# Patient Record
Sex: Female | Born: 1935 | Race: White | Hispanic: No
Health system: Southern US, Community
[De-identification: ages and names within clinical notes are randomized; demographics above are authoritative.]

## PROBLEM LIST (undated history)

## (undated) DIAGNOSIS — I10 Essential (primary) hypertension: Secondary | ICD-10-CM

## (undated) DIAGNOSIS — M949 Disorder of cartilage, unspecified: Secondary | ICD-10-CM

## (undated) DIAGNOSIS — E039 Hypothyroidism, unspecified: Secondary | ICD-10-CM

## (undated) DIAGNOSIS — K5730 Diverticulosis of large intestine without perforation or abscess without bleeding: Secondary | ICD-10-CM

## (undated) DIAGNOSIS — G479 Sleep disorder, unspecified: Secondary | ICD-10-CM

## (undated) DIAGNOSIS — E538 Deficiency of other specified B group vitamins: Secondary | ICD-10-CM

## (undated) DIAGNOSIS — IMO0001 Reserved for inherently not codable concepts without codable children: Secondary | ICD-10-CM

## (undated) DIAGNOSIS — E785 Hyperlipidemia, unspecified: Secondary | ICD-10-CM

## (undated) DIAGNOSIS — M419 Scoliosis, unspecified: Secondary | ICD-10-CM

## (undated) DIAGNOSIS — Z86010 Personal history of colonic polyps: Secondary | ICD-10-CM

## (undated) DIAGNOSIS — K219 Gastro-esophageal reflux disease without esophagitis: Secondary | ICD-10-CM

## (undated) DIAGNOSIS — M899 Disorder of bone, unspecified: Secondary | ICD-10-CM

## (undated) HISTORY — DX: Personal history of colonic polyps: Z86.010

## (undated) HISTORY — DX: Essential (primary) hypertension: I10

## (undated) HISTORY — DX: Disorder of bone, unspecified: M89.9

## (undated) HISTORY — DX: Reserved for inherently not codable concepts without codable children: IMO0001

## (undated) HISTORY — DX: Hypothyroidism, unspecified: E03.9

## (undated) HISTORY — DX: Hyperlipidemia, unspecified: E78.5

## (undated) HISTORY — DX: Gastro-esophageal reflux disease without esophagitis: K21.9

## (undated) HISTORY — DX: Diverticulosis of large intestine without perforation or abscess without bleeding: K57.30

## (undated) HISTORY — DX: Disorder of cartilage, unspecified: M94.9

## (undated) HISTORY — DX: Deficiency of other specified B group vitamins: E53.8

## (undated) HISTORY — DX: Scoliosis, unspecified: M41.9

## (undated) HISTORY — DX: Sleep disorder, unspecified: G47.9

---

## 1998-09-26 ENCOUNTER — Other Ambulatory Visit: Admission: RE | Admit: 1998-09-26 | Discharge: 1998-09-26 | Payer: Self-pay

## 1999-10-08 ENCOUNTER — Other Ambulatory Visit: Admission: RE | Admit: 1999-10-08 | Discharge: 1999-10-08 | Payer: Self-pay | Admitting: Obstetrics and Gynecology

## 1999-10-11 ENCOUNTER — Ambulatory Visit (HOSPITAL_COMMUNITY): Admission: RE | Admit: 1999-10-11 | Discharge: 1999-10-11 | Payer: Self-pay | Admitting: Obstetrics and Gynecology

## 2000-02-18 ENCOUNTER — Encounter: Payer: Self-pay | Admitting: Family Medicine

## 2000-02-18 ENCOUNTER — Encounter: Admission: RE | Admit: 2000-02-18 | Discharge: 2000-02-18 | Payer: Self-pay | Admitting: Family Medicine

## 2001-02-06 ENCOUNTER — Other Ambulatory Visit: Admission: RE | Admit: 2001-02-06 | Discharge: 2001-02-06 | Payer: Self-pay | Admitting: Family Medicine

## 2002-01-13 ENCOUNTER — Encounter: Payer: Self-pay | Admitting: Family Medicine

## 2002-01-13 ENCOUNTER — Encounter: Admission: RE | Admit: 2002-01-13 | Discharge: 2002-01-13 | Payer: Self-pay | Admitting: Family Medicine

## 2002-09-03 ENCOUNTER — Encounter: Admission: RE | Admit: 2002-09-03 | Discharge: 2002-09-03 | Payer: Self-pay | Admitting: Internal Medicine

## 2002-09-03 ENCOUNTER — Encounter: Payer: Self-pay | Admitting: Internal Medicine

## 2003-07-26 ENCOUNTER — Encounter: Admission: RE | Admit: 2003-07-26 | Discharge: 2003-07-26 | Payer: Self-pay | Admitting: Internal Medicine

## 2004-05-07 ENCOUNTER — Encounter: Admission: RE | Admit: 2004-05-07 | Discharge: 2004-05-07 | Payer: Self-pay | Admitting: Family Medicine

## 2004-07-23 ENCOUNTER — Ambulatory Visit: Payer: Self-pay | Admitting: Pulmonary Disease

## 2004-08-31 ENCOUNTER — Ambulatory Visit: Payer: Self-pay | Admitting: Pulmonary Disease

## 2004-10-11 ENCOUNTER — Ambulatory Visit (HOSPITAL_COMMUNITY): Admission: RE | Admit: 2004-10-11 | Discharge: 2004-10-11 | Payer: Self-pay | Admitting: Internal Medicine

## 2005-04-09 ENCOUNTER — Ambulatory Visit: Payer: Self-pay | Admitting: Pulmonary Disease

## 2005-05-16 ENCOUNTER — Ambulatory Visit: Payer: Self-pay | Admitting: Pulmonary Disease

## 2005-12-24 ENCOUNTER — Ambulatory Visit (HOSPITAL_COMMUNITY): Admission: RE | Admit: 2005-12-24 | Discharge: 2005-12-24 | Payer: Self-pay | Admitting: Internal Medicine

## 2007-04-30 ENCOUNTER — Ambulatory Visit (HOSPITAL_COMMUNITY): Admission: RE | Admit: 2007-04-30 | Discharge: 2007-04-30 | Payer: Self-pay | Admitting: *Deleted

## 2008-05-31 ENCOUNTER — Ambulatory Visit (HOSPITAL_COMMUNITY): Admission: RE | Admit: 2008-05-31 | Discharge: 2008-05-31 | Payer: Self-pay | Admitting: *Deleted

## 2009-06-20 ENCOUNTER — Ambulatory Visit (HOSPITAL_COMMUNITY): Admission: RE | Admit: 2009-06-20 | Discharge: 2009-06-20 | Payer: Self-pay | Admitting: *Deleted

## 2009-12-28 ENCOUNTER — Encounter: Admission: RE | Admit: 2009-12-28 | Discharge: 2009-12-28 | Payer: Self-pay | Admitting: Internal Medicine

## 2010-07-24 ENCOUNTER — Ambulatory Visit (HOSPITAL_COMMUNITY): Admission: RE | Admit: 2010-07-24 | Discharge: 2010-07-24 | Payer: Self-pay | Admitting: Pediatric Nephrology

## 2010-09-25 ENCOUNTER — Emergency Department (HOSPITAL_BASED_OUTPATIENT_CLINIC_OR_DEPARTMENT_OTHER)
Admission: EM | Admit: 2010-09-25 | Discharge: 2010-09-26 | Payer: Self-pay | Source: Home / Self Care | Admitting: Emergency Medicine

## 2010-10-07 ENCOUNTER — Encounter: Payer: Self-pay | Admitting: Family Medicine

## 2011-08-19 ENCOUNTER — Other Ambulatory Visit (HOSPITAL_COMMUNITY): Payer: Self-pay | Admitting: Family Medicine

## 2011-08-19 DIAGNOSIS — Z1231 Encounter for screening mammogram for malignant neoplasm of breast: Secondary | ICD-10-CM

## 2011-08-30 ENCOUNTER — Ambulatory Visit (HOSPITAL_COMMUNITY)
Admission: RE | Admit: 2011-08-30 | Discharge: 2011-08-30 | Disposition: A | Discharge status: Home / Self Care | Payer: Medicare Other | Source: Ambulatory Visit | Attending: Family Medicine | Admitting: Family Medicine

## 2011-08-30 DIAGNOSIS — Z1231 Encounter for screening mammogram for malignant neoplasm of breast: Secondary | ICD-10-CM | POA: Insufficient documentation

## 2011-11-04 DIAGNOSIS — H43819 Vitreous degeneration, unspecified eye: Secondary | ICD-10-CM | POA: Diagnosis not present

## 2011-11-04 DIAGNOSIS — H2510 Age-related nuclear cataract, unspecified eye: Secondary | ICD-10-CM | POA: Diagnosis not present

## 2011-11-04 DIAGNOSIS — H35039 Hypertensive retinopathy, unspecified eye: Secondary | ICD-10-CM | POA: Diagnosis not present

## 2011-11-04 DIAGNOSIS — H538 Other visual disturbances: Secondary | ICD-10-CM | POA: Diagnosis not present

## 2011-12-12 DIAGNOSIS — E039 Hypothyroidism, unspecified: Secondary | ICD-10-CM | POA: Diagnosis not present

## 2011-12-12 DIAGNOSIS — E785 Hyperlipidemia, unspecified: Secondary | ICD-10-CM | POA: Diagnosis not present

## 2011-12-12 DIAGNOSIS — I10 Essential (primary) hypertension: Secondary | ICD-10-CM | POA: Diagnosis not present

## 2011-12-12 DIAGNOSIS — Z79899 Other long term (current) drug therapy: Secondary | ICD-10-CM | POA: Diagnosis not present

## 2011-12-26 DIAGNOSIS — Z79899 Other long term (current) drug therapy: Secondary | ICD-10-CM | POA: Diagnosis not present

## 2011-12-26 DIAGNOSIS — E039 Hypothyroidism, unspecified: Secondary | ICD-10-CM | POA: Diagnosis not present

## 2011-12-26 DIAGNOSIS — I10 Essential (primary) hypertension: Secondary | ICD-10-CM | POA: Diagnosis not present

## 2011-12-26 DIAGNOSIS — E782 Mixed hyperlipidemia: Secondary | ICD-10-CM | POA: Diagnosis not present

## 2012-01-14 DIAGNOSIS — R490 Dysphonia: Secondary | ICD-10-CM | POA: Diagnosis not present

## 2012-01-14 DIAGNOSIS — K219 Gastro-esophageal reflux disease without esophagitis: Secondary | ICD-10-CM | POA: Diagnosis not present

## 2012-09-02 DIAGNOSIS — M5137 Other intervertebral disc degeneration, lumbosacral region: Secondary | ICD-10-CM | POA: Diagnosis not present

## 2012-09-02 DIAGNOSIS — M999 Biomechanical lesion, unspecified: Secondary | ICD-10-CM | POA: Diagnosis not present

## 2012-09-02 DIAGNOSIS — M62838 Other muscle spasm: Secondary | ICD-10-CM | POA: Diagnosis not present

## 2012-10-12 ENCOUNTER — Other Ambulatory Visit (HOSPITAL_COMMUNITY): Payer: Self-pay | Admitting: Family Medicine

## 2012-10-12 DIAGNOSIS — Z1231 Encounter for screening mammogram for malignant neoplasm of breast: Secondary | ICD-10-CM

## 2012-10-22 ENCOUNTER — Ambulatory Visit (HOSPITAL_COMMUNITY)
Admission: RE | Admit: 2012-10-22 | Discharge: 2012-10-22 | Disposition: A | Discharge status: Home / Self Care | Payer: Medicare Other | Source: Ambulatory Visit | Attending: Family Medicine | Admitting: Family Medicine

## 2012-10-22 DIAGNOSIS — Z1231 Encounter for screening mammogram for malignant neoplasm of breast: Secondary | ICD-10-CM | POA: Diagnosis not present

## 2012-11-09 DIAGNOSIS — H04129 Dry eye syndrome of unspecified lacrimal gland: Secondary | ICD-10-CM | POA: Diagnosis not present

## 2012-12-03 DIAGNOSIS — R7301 Impaired fasting glucose: Secondary | ICD-10-CM | POA: Diagnosis not present

## 2012-12-15 DIAGNOSIS — E782 Mixed hyperlipidemia: Secondary | ICD-10-CM | POA: Diagnosis not present

## 2012-12-15 DIAGNOSIS — E7800 Pure hypercholesterolemia, unspecified: Secondary | ICD-10-CM | POA: Diagnosis not present

## 2012-12-15 DIAGNOSIS — I10 Essential (primary) hypertension: Secondary | ICD-10-CM | POA: Diagnosis not present

## 2012-12-15 DIAGNOSIS — Z79899 Other long term (current) drug therapy: Secondary | ICD-10-CM | POA: Diagnosis not present

## 2012-12-15 DIAGNOSIS — E039 Hypothyroidism, unspecified: Secondary | ICD-10-CM | POA: Diagnosis not present

## 2013-01-11 DIAGNOSIS — Z86010 Personal history of colonic polyps: Secondary | ICD-10-CM | POA: Diagnosis not present

## 2013-01-11 DIAGNOSIS — K219 Gastro-esophageal reflux disease without esophagitis: Secondary | ICD-10-CM | POA: Diagnosis not present

## 2013-01-20 DIAGNOSIS — K219 Gastro-esophageal reflux disease without esophagitis: Secondary | ICD-10-CM | POA: Diagnosis not present

## 2013-01-20 DIAGNOSIS — R490 Dysphonia: Secondary | ICD-10-CM | POA: Diagnosis not present

## 2013-02-05 DIAGNOSIS — J383 Other diseases of vocal cords: Secondary | ICD-10-CM | POA: Diagnosis not present

## 2013-02-05 DIAGNOSIS — D141 Benign neoplasm of larynx: Secondary | ICD-10-CM | POA: Diagnosis not present

## 2013-02-05 DIAGNOSIS — R490 Dysphonia: Secondary | ICD-10-CM | POA: Diagnosis not present

## 2013-03-22 DIAGNOSIS — M999 Biomechanical lesion, unspecified: Secondary | ICD-10-CM | POA: Diagnosis not present

## 2013-03-22 DIAGNOSIS — M5137 Other intervertebral disc degeneration, lumbosacral region: Secondary | ICD-10-CM | POA: Diagnosis not present

## 2013-03-22 DIAGNOSIS — M62838 Other muscle spasm: Secondary | ICD-10-CM | POA: Diagnosis not present

## 2013-05-17 DIAGNOSIS — R209 Unspecified disturbances of skin sensation: Secondary | ICD-10-CM | POA: Diagnosis not present

## 2013-05-17 DIAGNOSIS — R232 Flushing: Secondary | ICD-10-CM | POA: Diagnosis not present

## 2013-05-17 DIAGNOSIS — E039 Hypothyroidism, unspecified: Secondary | ICD-10-CM | POA: Diagnosis not present

## 2013-05-17 DIAGNOSIS — Z7982 Long term (current) use of aspirin: Secondary | ICD-10-CM | POA: Diagnosis not present

## 2013-05-17 DIAGNOSIS — Z79899 Other long term (current) drug therapy: Secondary | ICD-10-CM | POA: Diagnosis not present

## 2013-05-17 DIAGNOSIS — I10 Essential (primary) hypertension: Secondary | ICD-10-CM | POA: Diagnosis not present

## 2013-05-24 DIAGNOSIS — K5730 Diverticulosis of large intestine without perforation or abscess without bleeding: Secondary | ICD-10-CM | POA: Diagnosis not present

## 2013-05-24 DIAGNOSIS — Z09 Encounter for follow-up examination after completed treatment for conditions other than malignant neoplasm: Secondary | ICD-10-CM | POA: Diagnosis not present

## 2013-05-24 DIAGNOSIS — Z86010 Personal history of colonic polyps: Secondary | ICD-10-CM | POA: Diagnosis not present

## 2013-05-24 DIAGNOSIS — D126 Benign neoplasm of colon, unspecified: Secondary | ICD-10-CM | POA: Diagnosis not present

## 2013-05-25 DIAGNOSIS — R7301 Impaired fasting glucose: Secondary | ICD-10-CM | POA: Diagnosis not present

## 2013-05-25 DIAGNOSIS — Z79899 Other long term (current) drug therapy: Secondary | ICD-10-CM | POA: Diagnosis not present

## 2013-05-25 DIAGNOSIS — R259 Unspecified abnormal involuntary movements: Secondary | ICD-10-CM | POA: Diagnosis not present

## 2013-05-25 DIAGNOSIS — R209 Unspecified disturbances of skin sensation: Secondary | ICD-10-CM | POA: Diagnosis not present

## 2013-05-25 DIAGNOSIS — E039 Hypothyroidism, unspecified: Secondary | ICD-10-CM | POA: Diagnosis not present

## 2013-05-25 DIAGNOSIS — R9389 Abnormal findings on diagnostic imaging of other specified body structures: Secondary | ICD-10-CM | POA: Diagnosis not present

## 2013-06-07 DIAGNOSIS — I10 Essential (primary) hypertension: Secondary | ICD-10-CM | POA: Diagnosis not present

## 2013-06-07 DIAGNOSIS — E871 Hypo-osmolality and hyponatremia: Secondary | ICD-10-CM | POA: Diagnosis not present

## 2013-06-07 DIAGNOSIS — F411 Generalized anxiety disorder: Secondary | ICD-10-CM | POA: Diagnosis not present

## 2013-06-07 DIAGNOSIS — E039 Hypothyroidism, unspecified: Secondary | ICD-10-CM | POA: Diagnosis not present

## 2013-07-05 DIAGNOSIS — I10 Essential (primary) hypertension: Secondary | ICD-10-CM | POA: Diagnosis not present

## 2013-07-20 DIAGNOSIS — I10 Essential (primary) hypertension: Secondary | ICD-10-CM | POA: Diagnosis not present

## 2013-07-20 DIAGNOSIS — E039 Hypothyroidism, unspecified: Secondary | ICD-10-CM | POA: Diagnosis not present

## 2013-07-20 DIAGNOSIS — E871 Hypo-osmolality and hyponatremia: Secondary | ICD-10-CM | POA: Diagnosis not present

## 2013-07-20 DIAGNOSIS — E782 Mixed hyperlipidemia: Secondary | ICD-10-CM | POA: Diagnosis not present

## 2013-08-04 DIAGNOSIS — J040 Acute laryngitis: Secondary | ICD-10-CM | POA: Diagnosis not present

## 2013-08-24 DIAGNOSIS — R490 Dysphonia: Secondary | ICD-10-CM | POA: Diagnosis not present

## 2013-08-24 DIAGNOSIS — J381 Polyp of vocal cord and larynx: Secondary | ICD-10-CM | POA: Diagnosis not present

## 2013-09-07 DIAGNOSIS — E039 Hypothyroidism, unspecified: Secondary | ICD-10-CM | POA: Diagnosis not present

## 2013-09-27 DIAGNOSIS — M5137 Other intervertebral disc degeneration, lumbosacral region: Secondary | ICD-10-CM | POA: Diagnosis not present

## 2013-09-27 DIAGNOSIS — M62838 Other muscle spasm: Secondary | ICD-10-CM | POA: Diagnosis not present

## 2013-09-27 DIAGNOSIS — M999 Biomechanical lesion, unspecified: Secondary | ICD-10-CM | POA: Diagnosis not present

## 2013-10-05 DIAGNOSIS — G609 Hereditary and idiopathic neuropathy, unspecified: Secondary | ICD-10-CM | POA: Diagnosis not present

## 2013-10-05 DIAGNOSIS — G2581 Restless legs syndrome: Secondary | ICD-10-CM | POA: Diagnosis not present

## 2013-10-05 DIAGNOSIS — R002 Palpitations: Secondary | ICD-10-CM | POA: Diagnosis not present

## 2013-10-05 DIAGNOSIS — M545 Low back pain, unspecified: Secondary | ICD-10-CM | POA: Diagnosis not present

## 2013-10-05 DIAGNOSIS — E559 Vitamin D deficiency, unspecified: Secondary | ICD-10-CM | POA: Diagnosis not present

## 2013-10-18 DIAGNOSIS — M62838 Other muscle spasm: Secondary | ICD-10-CM | POA: Diagnosis not present

## 2013-10-18 DIAGNOSIS — M999 Biomechanical lesion, unspecified: Secondary | ICD-10-CM | POA: Diagnosis not present

## 2013-10-18 DIAGNOSIS — M5137 Other intervertebral disc degeneration, lumbosacral region: Secondary | ICD-10-CM | POA: Diagnosis not present

## 2013-10-25 DIAGNOSIS — M62838 Other muscle spasm: Secondary | ICD-10-CM | POA: Diagnosis not present

## 2013-10-25 DIAGNOSIS — M999 Biomechanical lesion, unspecified: Secondary | ICD-10-CM | POA: Diagnosis not present

## 2013-10-25 DIAGNOSIS — M5137 Other intervertebral disc degeneration, lumbosacral region: Secondary | ICD-10-CM | POA: Diagnosis not present

## 2013-11-05 DIAGNOSIS — E039 Hypothyroidism, unspecified: Secondary | ICD-10-CM | POA: Diagnosis not present

## 2013-11-05 DIAGNOSIS — M545 Low back pain, unspecified: Secondary | ICD-10-CM | POA: Diagnosis not present

## 2013-11-05 DIAGNOSIS — E782 Mixed hyperlipidemia: Secondary | ICD-10-CM | POA: Diagnosis not present

## 2013-11-05 DIAGNOSIS — G2581 Restless legs syndrome: Secondary | ICD-10-CM | POA: Diagnosis not present

## 2013-11-05 DIAGNOSIS — R002 Palpitations: Secondary | ICD-10-CM | POA: Diagnosis not present

## 2013-11-05 DIAGNOSIS — I10 Essential (primary) hypertension: Secondary | ICD-10-CM | POA: Diagnosis not present

## 2013-11-05 DIAGNOSIS — G609 Hereditary and idiopathic neuropathy, unspecified: Secondary | ICD-10-CM | POA: Diagnosis not present

## 2013-11-05 DIAGNOSIS — E871 Hypo-osmolality and hyponatremia: Secondary | ICD-10-CM | POA: Diagnosis not present

## 2013-12-09 ENCOUNTER — Other Ambulatory Visit (HOSPITAL_COMMUNITY): Payer: Self-pay | Admitting: Family Medicine

## 2013-12-09 DIAGNOSIS — Z1231 Encounter for screening mammogram for malignant neoplasm of breast: Secondary | ICD-10-CM

## 2013-12-16 ENCOUNTER — Ambulatory Visit (HOSPITAL_COMMUNITY)
Admission: RE | Admit: 2013-12-16 | Discharge: 2013-12-16 | Disposition: A | Discharge status: Home / Self Care | Payer: Medicare Other | Source: Ambulatory Visit | Attending: Family Medicine | Admitting: Family Medicine

## 2013-12-16 DIAGNOSIS — Z1231 Encounter for screening mammogram for malignant neoplasm of breast: Secondary | ICD-10-CM | POA: Diagnosis not present

## 2013-12-23 DIAGNOSIS — L80 Vitiligo: Secondary | ICD-10-CM | POA: Diagnosis not present

## 2013-12-23 DIAGNOSIS — L821 Other seborrheic keratosis: Secondary | ICD-10-CM | POA: Diagnosis not present

## 2013-12-23 DIAGNOSIS — Z808 Family history of malignant neoplasm of other organs or systems: Secondary | ICD-10-CM | POA: Diagnosis not present

## 2013-12-23 DIAGNOSIS — L723 Sebaceous cyst: Secondary | ICD-10-CM | POA: Diagnosis not present

## 2013-12-23 DIAGNOSIS — D239 Other benign neoplasm of skin, unspecified: Secondary | ICD-10-CM | POA: Diagnosis not present

## 2014-01-11 DIAGNOSIS — I10 Essential (primary) hypertension: Secondary | ICD-10-CM | POA: Diagnosis not present

## 2014-01-11 DIAGNOSIS — F411 Generalized anxiety disorder: Secondary | ICD-10-CM | POA: Diagnosis not present

## 2014-01-11 DIAGNOSIS — Z0000 Encounter for general adult medical examination without abnormal findings: Secondary | ICD-10-CM | POA: Diagnosis not present

## 2014-01-11 DIAGNOSIS — G479 Sleep disorder, unspecified: Secondary | ICD-10-CM | POA: Diagnosis not present

## 2014-01-11 DIAGNOSIS — E782 Mixed hyperlipidemia: Secondary | ICD-10-CM | POA: Diagnosis not present

## 2014-01-11 DIAGNOSIS — E039 Hypothyroidism, unspecified: Secondary | ICD-10-CM | POA: Diagnosis not present

## 2014-01-11 DIAGNOSIS — E2839 Other primary ovarian failure: Secondary | ICD-10-CM | POA: Diagnosis not present

## 2014-01-11 DIAGNOSIS — Z01419 Encounter for gynecological examination (general) (routine) without abnormal findings: Secondary | ICD-10-CM | POA: Diagnosis not present

## 2014-01-11 DIAGNOSIS — K219 Gastro-esophageal reflux disease without esophagitis: Secondary | ICD-10-CM | POA: Diagnosis not present

## 2014-02-03 DIAGNOSIS — L0201 Cutaneous abscess of face: Secondary | ICD-10-CM | POA: Diagnosis not present

## 2014-02-03 DIAGNOSIS — L03211 Cellulitis of face: Secondary | ICD-10-CM | POA: Diagnosis not present

## 2014-02-09 DIAGNOSIS — M899 Disorder of bone, unspecified: Secondary | ICD-10-CM | POA: Diagnosis not present

## 2014-02-09 DIAGNOSIS — M949 Disorder of cartilage, unspecified: Secondary | ICD-10-CM | POA: Diagnosis not present

## 2014-03-01 DIAGNOSIS — J209 Acute bronchitis, unspecified: Secondary | ICD-10-CM | POA: Diagnosis not present

## 2014-04-11 DIAGNOSIS — E782 Mixed hyperlipidemia: Secondary | ICD-10-CM | POA: Diagnosis not present

## 2014-04-11 DIAGNOSIS — R7309 Other abnormal glucose: Secondary | ICD-10-CM | POA: Diagnosis not present

## 2014-04-11 DIAGNOSIS — E039 Hypothyroidism, unspecified: Secondary | ICD-10-CM | POA: Diagnosis not present

## 2014-04-11 DIAGNOSIS — I10 Essential (primary) hypertension: Secondary | ICD-10-CM | POA: Diagnosis not present

## 2014-04-11 DIAGNOSIS — G609 Hereditary and idiopathic neuropathy, unspecified: Secondary | ICD-10-CM | POA: Diagnosis not present

## 2014-04-11 DIAGNOSIS — E559 Vitamin D deficiency, unspecified: Secondary | ICD-10-CM | POA: Diagnosis not present

## 2014-04-13 DIAGNOSIS — Z23 Encounter for immunization: Secondary | ICD-10-CM | POA: Diagnosis not present

## 2014-04-13 DIAGNOSIS — M949 Disorder of cartilage, unspecified: Secondary | ICD-10-CM | POA: Diagnosis not present

## 2014-04-13 DIAGNOSIS — K219 Gastro-esophageal reflux disease without esophagitis: Secondary | ICD-10-CM | POA: Diagnosis not present

## 2014-04-13 DIAGNOSIS — M899 Disorder of bone, unspecified: Secondary | ICD-10-CM | POA: Diagnosis not present

## 2014-04-13 DIAGNOSIS — E039 Hypothyroidism, unspecified: Secondary | ICD-10-CM | POA: Diagnosis not present

## 2014-04-13 DIAGNOSIS — I10 Essential (primary) hypertension: Secondary | ICD-10-CM | POA: Diagnosis not present

## 2014-04-13 DIAGNOSIS — E782 Mixed hyperlipidemia: Secondary | ICD-10-CM | POA: Diagnosis not present

## 2014-04-13 DIAGNOSIS — G479 Sleep disorder, unspecified: Secondary | ICD-10-CM | POA: Diagnosis not present

## 2014-04-13 DIAGNOSIS — Z86010 Personal history of colonic polyps: Secondary | ICD-10-CM | POA: Diagnosis not present

## 2014-05-07 ENCOUNTER — Encounter: Payer: Self-pay | Admitting: *Deleted

## 2014-06-08 DIAGNOSIS — K219 Gastro-esophageal reflux disease without esophagitis: Secondary | ICD-10-CM | POA: Diagnosis not present

## 2014-06-08 DIAGNOSIS — J381 Polyp of vocal cord and larynx: Secondary | ICD-10-CM | POA: Diagnosis not present

## 2014-06-08 DIAGNOSIS — R490 Dysphonia: Secondary | ICD-10-CM | POA: Diagnosis not present

## 2014-06-24 DIAGNOSIS — Z23 Encounter for immunization: Secondary | ICD-10-CM | POA: Diagnosis not present

## 2014-09-06 DIAGNOSIS — I10 Essential (primary) hypertension: Secondary | ICD-10-CM | POA: Diagnosis not present

## 2014-09-06 DIAGNOSIS — G609 Hereditary and idiopathic neuropathy, unspecified: Secondary | ICD-10-CM | POA: Diagnosis not present

## 2014-09-06 DIAGNOSIS — E782 Mixed hyperlipidemia: Secondary | ICD-10-CM | POA: Diagnosis not present

## 2014-09-06 DIAGNOSIS — E039 Hypothyroidism, unspecified: Secondary | ICD-10-CM | POA: Diagnosis not present

## 2014-09-06 DIAGNOSIS — M899 Disorder of bone, unspecified: Secondary | ICD-10-CM | POA: Diagnosis not present

## 2014-09-06 DIAGNOSIS — Z86010 Personal history of colonic polyps: Secondary | ICD-10-CM | POA: Diagnosis not present

## 2014-09-06 DIAGNOSIS — Z1211 Encounter for screening for malignant neoplasm of colon: Secondary | ICD-10-CM | POA: Diagnosis not present

## 2014-09-06 DIAGNOSIS — K219 Gastro-esophageal reflux disease without esophagitis: Secondary | ICD-10-CM | POA: Diagnosis not present

## 2014-10-11 DIAGNOSIS — E2839 Other primary ovarian failure: Secondary | ICD-10-CM | POA: Diagnosis not present

## 2014-10-11 DIAGNOSIS — E538 Deficiency of other specified B group vitamins: Secondary | ICD-10-CM | POA: Diagnosis not present

## 2014-10-11 DIAGNOSIS — G479 Sleep disorder, unspecified: Secondary | ICD-10-CM | POA: Diagnosis not present

## 2014-10-11 DIAGNOSIS — E559 Vitamin D deficiency, unspecified: Secondary | ICD-10-CM | POA: Diagnosis not present

## 2014-10-11 DIAGNOSIS — I10 Essential (primary) hypertension: Secondary | ICD-10-CM | POA: Diagnosis not present

## 2014-10-11 DIAGNOSIS — R7309 Other abnormal glucose: Secondary | ICD-10-CM | POA: Diagnosis not present

## 2014-10-11 DIAGNOSIS — E039 Hypothyroidism, unspecified: Secondary | ICD-10-CM | POA: Diagnosis not present

## 2014-10-11 DIAGNOSIS — G609 Hereditary and idiopathic neuropathy, unspecified: Secondary | ICD-10-CM | POA: Diagnosis not present

## 2014-10-11 DIAGNOSIS — Z86010 Personal history of colonic polyps: Secondary | ICD-10-CM | POA: Diagnosis not present

## 2014-10-11 DIAGNOSIS — M899 Disorder of bone, unspecified: Secondary | ICD-10-CM | POA: Diagnosis not present

## 2014-10-11 DIAGNOSIS — E782 Mixed hyperlipidemia: Secondary | ICD-10-CM | POA: Diagnosis not present

## 2014-10-11 DIAGNOSIS — K219 Gastro-esophageal reflux disease without esophagitis: Secondary | ICD-10-CM | POA: Diagnosis not present

## 2014-10-14 DIAGNOSIS — J387 Other diseases of larynx: Secondary | ICD-10-CM | POA: Diagnosis not present

## 2014-10-14 DIAGNOSIS — E039 Hypothyroidism, unspecified: Secondary | ICD-10-CM | POA: Diagnosis not present

## 2014-10-14 DIAGNOSIS — R7309 Other abnormal glucose: Secondary | ICD-10-CM | POA: Diagnosis not present

## 2014-10-14 DIAGNOSIS — E559 Vitamin D deficiency, unspecified: Secondary | ICD-10-CM | POA: Diagnosis not present

## 2014-10-14 DIAGNOSIS — I10 Essential (primary) hypertension: Secondary | ICD-10-CM | POA: Diagnosis not present

## 2014-10-14 DIAGNOSIS — K219 Gastro-esophageal reflux disease without esophagitis: Secondary | ICD-10-CM | POA: Diagnosis not present

## 2014-10-14 DIAGNOSIS — E538 Deficiency of other specified B group vitamins: Secondary | ICD-10-CM | POA: Diagnosis not present

## 2014-10-14 DIAGNOSIS — E782 Mixed hyperlipidemia: Secondary | ICD-10-CM | POA: Diagnosis not present

## 2014-10-14 DIAGNOSIS — Z86010 Personal history of colonic polyps: Secondary | ICD-10-CM | POA: Diagnosis not present

## 2014-10-14 DIAGNOSIS — G479 Sleep disorder, unspecified: Secondary | ICD-10-CM | POA: Diagnosis not present

## 2014-11-07 DIAGNOSIS — M62830 Muscle spasm of back: Secondary | ICD-10-CM | POA: Diagnosis not present

## 2014-11-07 DIAGNOSIS — M5136 Other intervertebral disc degeneration, lumbar region: Secondary | ICD-10-CM | POA: Diagnosis not present

## 2014-11-07 DIAGNOSIS — M9903 Segmental and somatic dysfunction of lumbar region: Secondary | ICD-10-CM | POA: Diagnosis not present

## 2014-11-18 DIAGNOSIS — R0981 Nasal congestion: Secondary | ICD-10-CM | POA: Diagnosis not present

## 2015-01-09 DIAGNOSIS — G479 Sleep disorder, unspecified: Secondary | ICD-10-CM | POA: Diagnosis not present

## 2015-01-09 DIAGNOSIS — R7309 Other abnormal glucose: Secondary | ICD-10-CM | POA: Diagnosis not present

## 2015-01-09 DIAGNOSIS — E782 Mixed hyperlipidemia: Secondary | ICD-10-CM | POA: Diagnosis not present

## 2015-01-09 DIAGNOSIS — I10 Essential (primary) hypertension: Secondary | ICD-10-CM | POA: Diagnosis not present

## 2015-01-09 DIAGNOSIS — E039 Hypothyroidism, unspecified: Secondary | ICD-10-CM | POA: Diagnosis not present

## 2015-01-09 DIAGNOSIS — Z86010 Personal history of colonic polyps: Secondary | ICD-10-CM | POA: Diagnosis not present

## 2015-01-09 DIAGNOSIS — E538 Deficiency of other specified B group vitamins: Secondary | ICD-10-CM | POA: Diagnosis not present

## 2015-01-09 DIAGNOSIS — K219 Gastro-esophageal reflux disease without esophagitis: Secondary | ICD-10-CM | POA: Diagnosis not present

## 2015-01-09 DIAGNOSIS — E559 Vitamin D deficiency, unspecified: Secondary | ICD-10-CM | POA: Diagnosis not present

## 2015-01-13 DIAGNOSIS — B354 Tinea corporis: Secondary | ICD-10-CM | POA: Diagnosis not present

## 2015-01-13 DIAGNOSIS — E039 Hypothyroidism, unspecified: Secondary | ICD-10-CM | POA: Diagnosis not present

## 2015-01-13 DIAGNOSIS — E782 Mixed hyperlipidemia: Secondary | ICD-10-CM | POA: Diagnosis not present

## 2015-01-13 DIAGNOSIS — E538 Deficiency of other specified B group vitamins: Secondary | ICD-10-CM | POA: Diagnosis not present

## 2015-01-13 DIAGNOSIS — E559 Vitamin D deficiency, unspecified: Secondary | ICD-10-CM | POA: Diagnosis not present

## 2015-01-13 DIAGNOSIS — I10 Essential (primary) hypertension: Secondary | ICD-10-CM | POA: Diagnosis not present

## 2015-01-13 DIAGNOSIS — Z0000 Encounter for general adult medical examination without abnormal findings: Secondary | ICD-10-CM | POA: Diagnosis not present

## 2015-01-13 DIAGNOSIS — K219 Gastro-esophageal reflux disease without esophagitis: Secondary | ICD-10-CM | POA: Diagnosis not present

## 2015-01-13 DIAGNOSIS — M899 Disorder of bone, unspecified: Secondary | ICD-10-CM | POA: Diagnosis not present

## 2015-03-02 DIAGNOSIS — M9903 Segmental and somatic dysfunction of lumbar region: Secondary | ICD-10-CM | POA: Diagnosis not present

## 2015-03-02 DIAGNOSIS — M5136 Other intervertebral disc degeneration, lumbar region: Secondary | ICD-10-CM | POA: Diagnosis not present

## 2015-03-02 DIAGNOSIS — M62830 Muscle spasm of back: Secondary | ICD-10-CM | POA: Diagnosis not present

## 2015-03-28 DIAGNOSIS — R490 Dysphonia: Secondary | ICD-10-CM | POA: Diagnosis not present

## 2015-03-28 DIAGNOSIS — J381 Polyp of vocal cord and larynx: Secondary | ICD-10-CM | POA: Diagnosis not present

## 2015-04-19 DIAGNOSIS — M899 Disorder of bone, unspecified: Secondary | ICD-10-CM | POA: Diagnosis not present

## 2015-04-19 DIAGNOSIS — E2839 Other primary ovarian failure: Secondary | ICD-10-CM | POA: Diagnosis not present

## 2015-04-19 DIAGNOSIS — R7309 Other abnormal glucose: Secondary | ICD-10-CM | POA: Diagnosis not present

## 2015-04-19 DIAGNOSIS — I10 Essential (primary) hypertension: Secondary | ICD-10-CM | POA: Diagnosis not present

## 2015-04-19 DIAGNOSIS — E039 Hypothyroidism, unspecified: Secondary | ICD-10-CM | POA: Diagnosis not present

## 2015-04-19 DIAGNOSIS — Z86010 Personal history of colonic polyps: Secondary | ICD-10-CM | POA: Diagnosis not present

## 2015-04-19 DIAGNOSIS — E538 Deficiency of other specified B group vitamins: Secondary | ICD-10-CM | POA: Diagnosis not present

## 2015-04-19 DIAGNOSIS — E782 Mixed hyperlipidemia: Secondary | ICD-10-CM | POA: Diagnosis not present

## 2015-04-19 DIAGNOSIS — E559 Vitamin D deficiency, unspecified: Secondary | ICD-10-CM | POA: Diagnosis not present

## 2015-04-19 DIAGNOSIS — K219 Gastro-esophageal reflux disease without esophagitis: Secondary | ICD-10-CM | POA: Diagnosis not present

## 2015-04-19 DIAGNOSIS — G609 Hereditary and idiopathic neuropathy, unspecified: Secondary | ICD-10-CM | POA: Diagnosis not present

## 2015-04-19 DIAGNOSIS — G479 Sleep disorder, unspecified: Secondary | ICD-10-CM | POA: Diagnosis not present

## 2015-04-27 DIAGNOSIS — F329 Major depressive disorder, single episode, unspecified: Secondary | ICD-10-CM | POA: Diagnosis not present

## 2015-04-27 DIAGNOSIS — I10 Essential (primary) hypertension: Secondary | ICD-10-CM | POA: Diagnosis not present

## 2015-04-27 DIAGNOSIS — E039 Hypothyroidism, unspecified: Secondary | ICD-10-CM | POA: Diagnosis not present

## 2015-04-27 DIAGNOSIS — E559 Vitamin D deficiency, unspecified: Secondary | ICD-10-CM | POA: Diagnosis not present

## 2015-04-27 DIAGNOSIS — E782 Mixed hyperlipidemia: Secondary | ICD-10-CM | POA: Diagnosis not present

## 2015-04-27 DIAGNOSIS — G479 Sleep disorder, unspecified: Secondary | ICD-10-CM | POA: Diagnosis not present

## 2015-04-27 DIAGNOSIS — R7301 Impaired fasting glucose: Secondary | ICD-10-CM | POA: Diagnosis not present

## 2015-04-27 DIAGNOSIS — E781 Pure hyperglyceridemia: Secondary | ICD-10-CM | POA: Diagnosis not present

## 2015-04-27 DIAGNOSIS — E538 Deficiency of other specified B group vitamins: Secondary | ICD-10-CM | POA: Diagnosis not present

## 2015-04-27 DIAGNOSIS — K219 Gastro-esophageal reflux disease without esophagitis: Secondary | ICD-10-CM | POA: Diagnosis not present

## 2015-08-02 DIAGNOSIS — M9903 Segmental and somatic dysfunction of lumbar region: Secondary | ICD-10-CM | POA: Diagnosis not present

## 2015-08-02 DIAGNOSIS — M62830 Muscle spasm of back: Secondary | ICD-10-CM | POA: Diagnosis not present

## 2015-08-02 DIAGNOSIS — M5136 Other intervertebral disc degeneration, lumbar region: Secondary | ICD-10-CM | POA: Diagnosis not present

## 2015-08-09 DIAGNOSIS — E559 Vitamin D deficiency, unspecified: Secondary | ICD-10-CM | POA: Diagnosis not present

## 2015-08-09 DIAGNOSIS — R7301 Impaired fasting glucose: Secondary | ICD-10-CM | POA: Diagnosis not present

## 2015-08-09 DIAGNOSIS — G479 Sleep disorder, unspecified: Secondary | ICD-10-CM | POA: Diagnosis not present

## 2015-08-09 DIAGNOSIS — E039 Hypothyroidism, unspecified: Secondary | ICD-10-CM | POA: Diagnosis not present

## 2015-08-09 DIAGNOSIS — F329 Major depressive disorder, single episode, unspecified: Secondary | ICD-10-CM | POA: Diagnosis not present

## 2015-08-09 DIAGNOSIS — I10 Essential (primary) hypertension: Secondary | ICD-10-CM | POA: Diagnosis not present

## 2015-08-09 DIAGNOSIS — E782 Mixed hyperlipidemia: Secondary | ICD-10-CM | POA: Diagnosis not present

## 2015-08-09 DIAGNOSIS — K219 Gastro-esophageal reflux disease without esophagitis: Secondary | ICD-10-CM | POA: Diagnosis not present

## 2015-08-09 DIAGNOSIS — E781 Pure hyperglyceridemia: Secondary | ICD-10-CM | POA: Diagnosis not present

## 2015-08-09 DIAGNOSIS — E538 Deficiency of other specified B group vitamins: Secondary | ICD-10-CM | POA: Diagnosis not present

## 2015-10-12 ENCOUNTER — Other Ambulatory Visit: Payer: Self-pay

## 2015-10-12 DIAGNOSIS — Z1231 Encounter for screening mammogram for malignant neoplasm of breast: Secondary | ICD-10-CM

## 2015-10-18 DIAGNOSIS — H25011 Cortical age-related cataract, right eye: Secondary | ICD-10-CM | POA: Diagnosis not present

## 2015-10-18 DIAGNOSIS — H25012 Cortical age-related cataract, left eye: Secondary | ICD-10-CM | POA: Diagnosis not present

## 2015-10-18 DIAGNOSIS — H35032 Hypertensive retinopathy, left eye: Secondary | ICD-10-CM | POA: Diagnosis not present

## 2015-10-18 DIAGNOSIS — H2511 Age-related nuclear cataract, right eye: Secondary | ICD-10-CM | POA: Diagnosis not present

## 2015-10-18 DIAGNOSIS — H2512 Age-related nuclear cataract, left eye: Secondary | ICD-10-CM | POA: Diagnosis not present

## 2015-10-18 DIAGNOSIS — H35031 Hypertensive retinopathy, right eye: Secondary | ICD-10-CM | POA: Diagnosis not present

## 2015-10-18 DIAGNOSIS — H35372 Puckering of macula, left eye: Secondary | ICD-10-CM | POA: Diagnosis not present

## 2015-10-19 DIAGNOSIS — E782 Mixed hyperlipidemia: Secondary | ICD-10-CM | POA: Diagnosis not present

## 2015-10-19 DIAGNOSIS — K219 Gastro-esophageal reflux disease without esophagitis: Secondary | ICD-10-CM | POA: Diagnosis not present

## 2015-10-19 DIAGNOSIS — G479 Sleep disorder, unspecified: Secondary | ICD-10-CM | POA: Diagnosis not present

## 2015-10-19 DIAGNOSIS — I10 Essential (primary) hypertension: Secondary | ICD-10-CM | POA: Diagnosis not present

## 2015-10-19 DIAGNOSIS — R7301 Impaired fasting glucose: Secondary | ICD-10-CM | POA: Diagnosis not present

## 2015-10-19 DIAGNOSIS — E781 Pure hyperglyceridemia: Secondary | ICD-10-CM | POA: Diagnosis not present

## 2015-10-19 DIAGNOSIS — E039 Hypothyroidism, unspecified: Secondary | ICD-10-CM | POA: Diagnosis not present

## 2015-10-19 DIAGNOSIS — E559 Vitamin D deficiency, unspecified: Secondary | ICD-10-CM | POA: Diagnosis not present

## 2015-10-19 DIAGNOSIS — E538 Deficiency of other specified B group vitamins: Secondary | ICD-10-CM | POA: Diagnosis not present

## 2015-10-19 DIAGNOSIS — F329 Major depressive disorder, single episode, unspecified: Secondary | ICD-10-CM | POA: Diagnosis not present

## 2015-10-27 ENCOUNTER — Ambulatory Visit: Payer: Medicare Other

## 2015-10-27 DIAGNOSIS — E538 Deficiency of other specified B group vitamins: Secondary | ICD-10-CM | POA: Diagnosis not present

## 2015-10-27 DIAGNOSIS — F329 Major depressive disorder, single episode, unspecified: Secondary | ICD-10-CM | POA: Diagnosis not present

## 2015-10-27 DIAGNOSIS — K219 Gastro-esophageal reflux disease without esophagitis: Secondary | ICD-10-CM | POA: Diagnosis not present

## 2015-10-27 DIAGNOSIS — E559 Vitamin D deficiency, unspecified: Secondary | ICD-10-CM | POA: Diagnosis not present

## 2015-10-27 DIAGNOSIS — G609 Hereditary and idiopathic neuropathy, unspecified: Secondary | ICD-10-CM | POA: Diagnosis not present

## 2015-10-27 DIAGNOSIS — E039 Hypothyroidism, unspecified: Secondary | ICD-10-CM | POA: Diagnosis not present

## 2015-10-27 DIAGNOSIS — I10 Essential (primary) hypertension: Secondary | ICD-10-CM | POA: Diagnosis not present

## 2015-10-27 DIAGNOSIS — L300 Nummular dermatitis: Secondary | ICD-10-CM | POA: Diagnosis not present

## 2015-10-27 DIAGNOSIS — R7301 Impaired fasting glucose: Secondary | ICD-10-CM | POA: Diagnosis not present

## 2015-10-27 DIAGNOSIS — E782 Mixed hyperlipidemia: Secondary | ICD-10-CM | POA: Diagnosis not present

## 2015-10-27 DIAGNOSIS — G479 Sleep disorder, unspecified: Secondary | ICD-10-CM | POA: Diagnosis not present

## 2015-10-27 DIAGNOSIS — Z1239 Encounter for other screening for malignant neoplasm of breast: Secondary | ICD-10-CM | POA: Diagnosis not present

## 2015-11-14 DIAGNOSIS — R05 Cough: Secondary | ICD-10-CM | POA: Diagnosis not present

## 2015-11-27 ENCOUNTER — Ambulatory Visit
Admission: RE | Admit: 2015-11-27 | Discharge: 2015-11-27 | Disposition: A | Discharge status: Home / Self Care | Payer: Medicare Other | Source: Ambulatory Visit

## 2015-11-27 DIAGNOSIS — Z1231 Encounter for screening mammogram for malignant neoplasm of breast: Secondary | ICD-10-CM | POA: Diagnosis not present

## 2015-12-19 DIAGNOSIS — E559 Vitamin D deficiency, unspecified: Secondary | ICD-10-CM | POA: Diagnosis not present

## 2015-12-19 DIAGNOSIS — Z1239 Encounter for other screening for malignant neoplasm of breast: Secondary | ICD-10-CM | POA: Diagnosis not present

## 2015-12-19 DIAGNOSIS — G609 Hereditary and idiopathic neuropathy, unspecified: Secondary | ICD-10-CM | POA: Diagnosis not present

## 2015-12-19 DIAGNOSIS — L300 Nummular dermatitis: Secondary | ICD-10-CM | POA: Diagnosis not present

## 2015-12-19 DIAGNOSIS — F329 Major depressive disorder, single episode, unspecified: Secondary | ICD-10-CM | POA: Diagnosis not present

## 2015-12-19 DIAGNOSIS — E039 Hypothyroidism, unspecified: Secondary | ICD-10-CM | POA: Diagnosis not present

## 2015-12-19 DIAGNOSIS — E782 Mixed hyperlipidemia: Secondary | ICD-10-CM | POA: Diagnosis not present

## 2015-12-19 DIAGNOSIS — E538 Deficiency of other specified B group vitamins: Secondary | ICD-10-CM | POA: Diagnosis not present

## 2015-12-19 DIAGNOSIS — I10 Essential (primary) hypertension: Secondary | ICD-10-CM | POA: Diagnosis not present

## 2015-12-19 DIAGNOSIS — K219 Gastro-esophageal reflux disease without esophagitis: Secondary | ICD-10-CM | POA: Diagnosis not present

## 2015-12-19 DIAGNOSIS — R7301 Impaired fasting glucose: Secondary | ICD-10-CM | POA: Diagnosis not present

## 2015-12-19 DIAGNOSIS — G479 Sleep disorder, unspecified: Secondary | ICD-10-CM | POA: Diagnosis not present

## 2015-12-22 DIAGNOSIS — L723 Sebaceous cyst: Secondary | ICD-10-CM | POA: Diagnosis not present

## 2015-12-28 DIAGNOSIS — L0211 Cutaneous abscess of neck: Secondary | ICD-10-CM | POA: Diagnosis not present

## 2016-01-01 DIAGNOSIS — L089 Local infection of the skin and subcutaneous tissue, unspecified: Secondary | ICD-10-CM | POA: Diagnosis not present

## 2016-01-01 DIAGNOSIS — L723 Sebaceous cyst: Secondary | ICD-10-CM | POA: Diagnosis not present

## 2016-01-10 DIAGNOSIS — L089 Local infection of the skin and subcutaneous tissue, unspecified: Secondary | ICD-10-CM | POA: Diagnosis not present

## 2016-01-10 DIAGNOSIS — L723 Sebaceous cyst: Secondary | ICD-10-CM | POA: Diagnosis not present

## 2016-01-15 DIAGNOSIS — I10 Essential (primary) hypertension: Secondary | ICD-10-CM | POA: Diagnosis not present

## 2016-01-15 DIAGNOSIS — G479 Sleep disorder, unspecified: Secondary | ICD-10-CM | POA: Diagnosis not present

## 2016-01-15 DIAGNOSIS — E039 Hypothyroidism, unspecified: Secondary | ICD-10-CM | POA: Diagnosis not present

## 2016-01-15 DIAGNOSIS — Z124 Encounter for screening for malignant neoplasm of cervix: Secondary | ICD-10-CM | POA: Diagnosis not present

## 2016-01-15 DIAGNOSIS — E559 Vitamin D deficiency, unspecified: Secondary | ICD-10-CM | POA: Diagnosis not present

## 2016-01-15 DIAGNOSIS — N765 Ulceration of vagina: Secondary | ICD-10-CM | POA: Diagnosis not present

## 2016-01-15 DIAGNOSIS — Z0001 Encounter for general adult medical examination with abnormal findings: Secondary | ICD-10-CM | POA: Diagnosis not present

## 2016-01-15 DIAGNOSIS — E782 Mixed hyperlipidemia: Secondary | ICD-10-CM | POA: Diagnosis not present

## 2016-01-15 DIAGNOSIS — M85851 Other specified disorders of bone density and structure, right thigh: Secondary | ICD-10-CM | POA: Diagnosis not present

## 2016-02-14 DIAGNOSIS — M85851 Other specified disorders of bone density and structure, right thigh: Secondary | ICD-10-CM | POA: Diagnosis not present

## 2016-02-14 DIAGNOSIS — M8589 Other specified disorders of bone density and structure, multiple sites: Secondary | ICD-10-CM | POA: Diagnosis not present

## 2016-02-21 DIAGNOSIS — L723 Sebaceous cyst: Secondary | ICD-10-CM | POA: Diagnosis not present

## 2016-02-21 DIAGNOSIS — L089 Local infection of the skin and subcutaneous tissue, unspecified: Secondary | ICD-10-CM | POA: Diagnosis not present

## 2016-02-29 DIAGNOSIS — N765 Ulceration of vagina: Secondary | ICD-10-CM | POA: Diagnosis not present

## 2016-02-29 DIAGNOSIS — E538 Deficiency of other specified B group vitamins: Secondary | ICD-10-CM | POA: Diagnosis not present

## 2016-04-23 DIAGNOSIS — R7301 Impaired fasting glucose: Secondary | ICD-10-CM | POA: Diagnosis not present

## 2016-04-23 DIAGNOSIS — E039 Hypothyroidism, unspecified: Secondary | ICD-10-CM | POA: Diagnosis not present

## 2016-04-23 DIAGNOSIS — E559 Vitamin D deficiency, unspecified: Secondary | ICD-10-CM | POA: Diagnosis not present

## 2016-05-08 DIAGNOSIS — E559 Vitamin D deficiency, unspecified: Secondary | ICD-10-CM | POA: Diagnosis not present

## 2016-05-08 DIAGNOSIS — N765 Ulceration of vagina: Secondary | ICD-10-CM | POA: Diagnosis not present

## 2016-05-08 DIAGNOSIS — M899 Disorder of bone, unspecified: Secondary | ICD-10-CM | POA: Diagnosis not present

## 2016-05-08 DIAGNOSIS — K219 Gastro-esophageal reflux disease without esophagitis: Secondary | ICD-10-CM | POA: Diagnosis not present

## 2016-05-08 DIAGNOSIS — E039 Hypothyroidism, unspecified: Secondary | ICD-10-CM | POA: Diagnosis not present

## 2016-05-08 DIAGNOSIS — I10 Essential (primary) hypertension: Secondary | ICD-10-CM | POA: Diagnosis not present

## 2016-05-08 DIAGNOSIS — E782 Mixed hyperlipidemia: Secondary | ICD-10-CM | POA: Diagnosis not present

## 2016-05-08 DIAGNOSIS — Z86010 Personal history of colonic polyps: Secondary | ICD-10-CM | POA: Diagnosis not present

## 2016-05-08 DIAGNOSIS — Z23 Encounter for immunization: Secondary | ICD-10-CM | POA: Diagnosis not present

## 2016-05-08 DIAGNOSIS — G479 Sleep disorder, unspecified: Secondary | ICD-10-CM | POA: Diagnosis not present

## 2016-06-13 DIAGNOSIS — K219 Gastro-esophageal reflux disease without esophagitis: Secondary | ICD-10-CM | POA: Diagnosis not present

## 2016-06-13 DIAGNOSIS — E782 Mixed hyperlipidemia: Secondary | ICD-10-CM | POA: Diagnosis not present

## 2016-06-13 DIAGNOSIS — M899 Disorder of bone, unspecified: Secondary | ICD-10-CM | POA: Diagnosis not present

## 2016-06-13 DIAGNOSIS — I10 Essential (primary) hypertension: Secondary | ICD-10-CM | POA: Diagnosis not present

## 2016-06-13 DIAGNOSIS — E039 Hypothyroidism, unspecified: Secondary | ICD-10-CM | POA: Diagnosis not present

## 2016-06-13 DIAGNOSIS — Z23 Encounter for immunization: Secondary | ICD-10-CM | POA: Diagnosis not present

## 2016-06-13 DIAGNOSIS — G479 Sleep disorder, unspecified: Secondary | ICD-10-CM | POA: Diagnosis not present

## 2016-06-13 DIAGNOSIS — Z86010 Personal history of colonic polyps: Secondary | ICD-10-CM | POA: Diagnosis not present

## 2016-06-13 DIAGNOSIS — E559 Vitamin D deficiency, unspecified: Secondary | ICD-10-CM | POA: Diagnosis not present

## 2016-08-05 DIAGNOSIS — E039 Hypothyroidism, unspecified: Secondary | ICD-10-CM | POA: Diagnosis not present

## 2016-11-14 DIAGNOSIS — K219 Gastro-esophageal reflux disease without esophagitis: Secondary | ICD-10-CM | POA: Diagnosis not present

## 2017-01-07 DIAGNOSIS — G479 Sleep disorder, unspecified: Secondary | ICD-10-CM | POA: Diagnosis not present

## 2017-01-07 DIAGNOSIS — Z23 Encounter for immunization: Secondary | ICD-10-CM | POA: Diagnosis not present

## 2017-01-07 DIAGNOSIS — G609 Hereditary and idiopathic neuropathy, unspecified: Secondary | ICD-10-CM | POA: Diagnosis not present

## 2017-01-07 DIAGNOSIS — R7301 Impaired fasting glucose: Secondary | ICD-10-CM | POA: Diagnosis not present

## 2017-01-07 DIAGNOSIS — M899 Disorder of bone, unspecified: Secondary | ICD-10-CM | POA: Diagnosis not present

## 2017-01-07 DIAGNOSIS — L0211 Cutaneous abscess of neck: Secondary | ICD-10-CM | POA: Diagnosis not present

## 2017-01-07 DIAGNOSIS — E559 Vitamin D deficiency, unspecified: Secondary | ICD-10-CM | POA: Diagnosis not present

## 2017-01-07 DIAGNOSIS — K219 Gastro-esophageal reflux disease without esophagitis: Secondary | ICD-10-CM | POA: Diagnosis not present

## 2017-01-07 DIAGNOSIS — E039 Hypothyroidism, unspecified: Secondary | ICD-10-CM | POA: Diagnosis not present

## 2017-01-07 DIAGNOSIS — E782 Mixed hyperlipidemia: Secondary | ICD-10-CM | POA: Diagnosis not present

## 2017-01-07 DIAGNOSIS — I10 Essential (primary) hypertension: Secondary | ICD-10-CM | POA: Diagnosis not present

## 2017-01-07 DIAGNOSIS — E538 Deficiency of other specified B group vitamins: Secondary | ICD-10-CM | POA: Diagnosis not present

## 2017-01-08 DIAGNOSIS — H2513 Age-related nuclear cataract, bilateral: Secondary | ICD-10-CM | POA: Diagnosis not present

## 2017-01-08 DIAGNOSIS — H43393 Other vitreous opacities, bilateral: Secondary | ICD-10-CM | POA: Diagnosis not present

## 2017-01-08 DIAGNOSIS — H35033 Hypertensive retinopathy, bilateral: Secondary | ICD-10-CM | POA: Diagnosis not present

## 2017-01-08 DIAGNOSIS — H25013 Cortical age-related cataract, bilateral: Secondary | ICD-10-CM | POA: Diagnosis not present

## 2017-01-15 DIAGNOSIS — I10 Essential (primary) hypertension: Secondary | ICD-10-CM | POA: Diagnosis not present

## 2017-01-15 DIAGNOSIS — E782 Mixed hyperlipidemia: Secondary | ICD-10-CM | POA: Diagnosis not present

## 2017-01-15 DIAGNOSIS — E119 Type 2 diabetes mellitus without complications: Secondary | ICD-10-CM | POA: Diagnosis not present

## 2017-01-15 DIAGNOSIS — L723 Sebaceous cyst: Secondary | ICD-10-CM | POA: Diagnosis not present

## 2017-01-15 DIAGNOSIS — Z1389 Encounter for screening for other disorder: Secondary | ICD-10-CM | POA: Diagnosis not present

## 2017-01-15 DIAGNOSIS — Z7984 Long term (current) use of oral hypoglycemic drugs: Secondary | ICD-10-CM | POA: Diagnosis not present

## 2017-01-15 DIAGNOSIS — M899 Disorder of bone, unspecified: Secondary | ICD-10-CM | POA: Diagnosis not present

## 2017-01-15 DIAGNOSIS — K219 Gastro-esophageal reflux disease without esophagitis: Secondary | ICD-10-CM | POA: Diagnosis not present

## 2017-01-15 DIAGNOSIS — Z0000 Encounter for general adult medical examination without abnormal findings: Secondary | ICD-10-CM | POA: Diagnosis not present

## 2017-01-15 DIAGNOSIS — L089 Local infection of the skin and subcutaneous tissue, unspecified: Secondary | ICD-10-CM | POA: Diagnosis not present

## 2017-01-15 DIAGNOSIS — H60542 Acute eczematoid otitis externa, left ear: Secondary | ICD-10-CM | POA: Diagnosis not present

## 2017-01-15 DIAGNOSIS — G479 Sleep disorder, unspecified: Secondary | ICD-10-CM | POA: Diagnosis not present

## 2017-04-08 ENCOUNTER — Other Ambulatory Visit: Payer: Self-pay | Admitting: Family Medicine

## 2017-04-08 DIAGNOSIS — Z1231 Encounter for screening mammogram for malignant neoplasm of breast: Secondary | ICD-10-CM

## 2017-04-10 ENCOUNTER — Ambulatory Visit
Admission: RE | Admit: 2017-04-10 | Discharge: 2017-04-10 | Disposition: A | Discharge status: Home / Self Care | Payer: Medicare Other | Source: Ambulatory Visit | Attending: Family Medicine | Admitting: Family Medicine

## 2017-04-10 DIAGNOSIS — Z1231 Encounter for screening mammogram for malignant neoplasm of breast: Secondary | ICD-10-CM | POA: Diagnosis not present

## 2017-05-06 DIAGNOSIS — K219 Gastro-esophageal reflux disease without esophagitis: Secondary | ICD-10-CM | POA: Diagnosis not present

## 2017-05-06 DIAGNOSIS — Z79899 Other long term (current) drug therapy: Secondary | ICD-10-CM | POA: Diagnosis not present

## 2017-05-06 DIAGNOSIS — L723 Sebaceous cyst: Secondary | ICD-10-CM | POA: Diagnosis not present

## 2017-05-06 DIAGNOSIS — E782 Mixed hyperlipidemia: Secondary | ICD-10-CM | POA: Diagnosis not present

## 2017-05-06 DIAGNOSIS — G479 Sleep disorder, unspecified: Secondary | ICD-10-CM | POA: Diagnosis not present

## 2017-05-06 DIAGNOSIS — Z0000 Encounter for general adult medical examination without abnormal findings: Secondary | ICD-10-CM | POA: Diagnosis not present

## 2017-05-06 DIAGNOSIS — I10 Essential (primary) hypertension: Secondary | ICD-10-CM | POA: Diagnosis not present

## 2017-05-06 DIAGNOSIS — E1165 Type 2 diabetes mellitus with hyperglycemia: Secondary | ICD-10-CM | POA: Diagnosis not present

## 2017-05-06 DIAGNOSIS — Z7984 Long term (current) use of oral hypoglycemic drugs: Secondary | ICD-10-CM | POA: Diagnosis not present

## 2017-05-06 DIAGNOSIS — L089 Local infection of the skin and subcutaneous tissue, unspecified: Secondary | ICD-10-CM | POA: Diagnosis not present

## 2017-05-06 DIAGNOSIS — Z1389 Encounter for screening for other disorder: Secondary | ICD-10-CM | POA: Diagnosis not present

## 2017-05-06 DIAGNOSIS — M899 Disorder of bone, unspecified: Secondary | ICD-10-CM | POA: Diagnosis not present

## 2017-06-24 DIAGNOSIS — Z23 Encounter for immunization: Secondary | ICD-10-CM | POA: Diagnosis not present

## 2017-07-31 DIAGNOSIS — G479 Sleep disorder, unspecified: Secondary | ICD-10-CM | POA: Diagnosis not present

## 2017-07-31 DIAGNOSIS — Z7984 Long term (current) use of oral hypoglycemic drugs: Secondary | ICD-10-CM | POA: Diagnosis not present

## 2017-07-31 DIAGNOSIS — M899 Disorder of bone, unspecified: Secondary | ICD-10-CM | POA: Diagnosis not present

## 2017-07-31 DIAGNOSIS — E039 Hypothyroidism, unspecified: Secondary | ICD-10-CM | POA: Diagnosis not present

## 2017-07-31 DIAGNOSIS — E119 Type 2 diabetes mellitus without complications: Secondary | ICD-10-CM | POA: Diagnosis not present

## 2017-07-31 DIAGNOSIS — I10 Essential (primary) hypertension: Secondary | ICD-10-CM | POA: Diagnosis not present

## 2017-07-31 DIAGNOSIS — E782 Mixed hyperlipidemia: Secondary | ICD-10-CM | POA: Diagnosis not present

## 2017-07-31 DIAGNOSIS — L089 Local infection of the skin and subcutaneous tissue, unspecified: Secondary | ICD-10-CM | POA: Diagnosis not present

## 2017-07-31 DIAGNOSIS — Z0000 Encounter for general adult medical examination without abnormal findings: Secondary | ICD-10-CM | POA: Diagnosis not present

## 2017-07-31 DIAGNOSIS — Z1389 Encounter for screening for other disorder: Secondary | ICD-10-CM | POA: Diagnosis not present

## 2017-07-31 DIAGNOSIS — K219 Gastro-esophageal reflux disease without esophagitis: Secondary | ICD-10-CM | POA: Diagnosis not present

## 2017-07-31 DIAGNOSIS — Z79899 Other long term (current) drug therapy: Secondary | ICD-10-CM | POA: Diagnosis not present

## 2017-07-31 DIAGNOSIS — L723 Sebaceous cyst: Secondary | ICD-10-CM | POA: Diagnosis not present

## 2017-07-31 DIAGNOSIS — E1165 Type 2 diabetes mellitus with hyperglycemia: Secondary | ICD-10-CM | POA: Diagnosis not present

## 2017-08-05 DIAGNOSIS — E559 Vitamin D deficiency, unspecified: Secondary | ICD-10-CM | POA: Diagnosis not present

## 2017-08-05 DIAGNOSIS — K219 Gastro-esophageal reflux disease without esophagitis: Secondary | ICD-10-CM | POA: Diagnosis not present

## 2017-08-05 DIAGNOSIS — E538 Deficiency of other specified B group vitamins: Secondary | ICD-10-CM | POA: Diagnosis not present

## 2017-08-05 DIAGNOSIS — Z7984 Long term (current) use of oral hypoglycemic drugs: Secondary | ICD-10-CM | POA: Diagnosis not present

## 2017-08-05 DIAGNOSIS — E119 Type 2 diabetes mellitus without complications: Secondary | ICD-10-CM | POA: Diagnosis not present

## 2017-08-05 DIAGNOSIS — G479 Sleep disorder, unspecified: Secondary | ICD-10-CM | POA: Diagnosis not present

## 2017-08-05 DIAGNOSIS — F329 Major depressive disorder, single episode, unspecified: Secondary | ICD-10-CM | POA: Diagnosis not present

## 2017-08-05 DIAGNOSIS — E782 Mixed hyperlipidemia: Secondary | ICD-10-CM | POA: Diagnosis not present

## 2017-08-05 DIAGNOSIS — I10 Essential (primary) hypertension: Secondary | ICD-10-CM | POA: Diagnosis not present

## 2017-08-05 DIAGNOSIS — E039 Hypothyroidism, unspecified: Secondary | ICD-10-CM | POA: Diagnosis not present

## 2017-08-26 DIAGNOSIS — E039 Hypothyroidism, unspecified: Secondary | ICD-10-CM | POA: Diagnosis not present

## 2017-08-26 DIAGNOSIS — G479 Sleep disorder, unspecified: Secondary | ICD-10-CM | POA: Diagnosis not present

## 2017-08-26 DIAGNOSIS — I10 Essential (primary) hypertension: Secondary | ICD-10-CM | POA: Diagnosis not present

## 2017-08-26 DIAGNOSIS — K219 Gastro-esophageal reflux disease without esophagitis: Secondary | ICD-10-CM | POA: Diagnosis not present

## 2017-08-26 DIAGNOSIS — F329 Major depressive disorder, single episode, unspecified: Secondary | ICD-10-CM | POA: Diagnosis not present

## 2017-08-26 DIAGNOSIS — E119 Type 2 diabetes mellitus without complications: Secondary | ICD-10-CM | POA: Diagnosis not present

## 2017-08-26 DIAGNOSIS — J019 Acute sinusitis, unspecified: Secondary | ICD-10-CM | POA: Diagnosis not present

## 2017-08-26 DIAGNOSIS — E559 Vitamin D deficiency, unspecified: Secondary | ICD-10-CM | POA: Diagnosis not present

## 2017-08-26 DIAGNOSIS — E538 Deficiency of other specified B group vitamins: Secondary | ICD-10-CM | POA: Diagnosis not present

## 2017-08-26 DIAGNOSIS — E782 Mixed hyperlipidemia: Secondary | ICD-10-CM | POA: Diagnosis not present

## 2017-11-04 DIAGNOSIS — I10 Essential (primary) hypertension: Secondary | ICD-10-CM | POA: Diagnosis not present

## 2017-11-04 DIAGNOSIS — E039 Hypothyroidism, unspecified: Secondary | ICD-10-CM | POA: Diagnosis not present

## 2017-11-04 DIAGNOSIS — E559 Vitamin D deficiency, unspecified: Secondary | ICD-10-CM | POA: Diagnosis not present

## 2017-11-04 DIAGNOSIS — F329 Major depressive disorder, single episode, unspecified: Secondary | ICD-10-CM | POA: Diagnosis not present

## 2017-11-04 DIAGNOSIS — E782 Mixed hyperlipidemia: Secondary | ICD-10-CM | POA: Diagnosis not present

## 2017-11-04 DIAGNOSIS — E119 Type 2 diabetes mellitus without complications: Secondary | ICD-10-CM | POA: Diagnosis not present

## 2017-11-04 DIAGNOSIS — K219 Gastro-esophageal reflux disease without esophagitis: Secondary | ICD-10-CM | POA: Diagnosis not present

## 2017-11-04 DIAGNOSIS — G479 Sleep disorder, unspecified: Secondary | ICD-10-CM | POA: Diagnosis not present

## 2017-11-04 DIAGNOSIS — E538 Deficiency of other specified B group vitamins: Secondary | ICD-10-CM | POA: Diagnosis not present

## 2017-11-10 DIAGNOSIS — H35033 Hypertensive retinopathy, bilateral: Secondary | ICD-10-CM | POA: Diagnosis not present

## 2018-01-12 DIAGNOSIS — H25013 Cortical age-related cataract, bilateral: Secondary | ICD-10-CM | POA: Diagnosis not present

## 2018-01-12 DIAGNOSIS — H2513 Age-related nuclear cataract, bilateral: Secondary | ICD-10-CM | POA: Diagnosis not present

## 2018-01-12 DIAGNOSIS — H35033 Hypertensive retinopathy, bilateral: Secondary | ICD-10-CM | POA: Diagnosis not present

## 2018-01-12 DIAGNOSIS — H35363 Drusen (degenerative) of macula, bilateral: Secondary | ICD-10-CM | POA: Diagnosis not present

## 2018-01-16 DIAGNOSIS — E119 Type 2 diabetes mellitus without complications: Secondary | ICD-10-CM | POA: Diagnosis not present

## 2018-01-16 DIAGNOSIS — E538 Deficiency of other specified B group vitamins: Secondary | ICD-10-CM | POA: Diagnosis not present

## 2018-01-16 DIAGNOSIS — E559 Vitamin D deficiency, unspecified: Secondary | ICD-10-CM | POA: Diagnosis not present

## 2018-01-16 DIAGNOSIS — E039 Hypothyroidism, unspecified: Secondary | ICD-10-CM | POA: Diagnosis not present

## 2018-01-22 DIAGNOSIS — E538 Deficiency of other specified B group vitamins: Secondary | ICD-10-CM | POA: Diagnosis not present

## 2018-01-22 DIAGNOSIS — I10 Essential (primary) hypertension: Secondary | ICD-10-CM | POA: Diagnosis not present

## 2018-01-22 DIAGNOSIS — Z1331 Encounter for screening for depression: Secondary | ICD-10-CM | POA: Diagnosis not present

## 2018-01-22 DIAGNOSIS — Z0000 Encounter for general adult medical examination without abnormal findings: Secondary | ICD-10-CM | POA: Diagnosis not present

## 2018-01-22 DIAGNOSIS — K219 Gastro-esophageal reflux disease without esophagitis: Secondary | ICD-10-CM | POA: Diagnosis not present

## 2018-01-22 DIAGNOSIS — E559 Vitamin D deficiency, unspecified: Secondary | ICD-10-CM | POA: Diagnosis not present

## 2018-01-22 DIAGNOSIS — E039 Hypothyroidism, unspecified: Secondary | ICD-10-CM | POA: Diagnosis not present

## 2018-01-22 DIAGNOSIS — E782 Mixed hyperlipidemia: Secondary | ICD-10-CM | POA: Diagnosis not present

## 2018-01-22 DIAGNOSIS — G479 Sleep disorder, unspecified: Secondary | ICD-10-CM | POA: Diagnosis not present

## 2018-01-22 DIAGNOSIS — E1169 Type 2 diabetes mellitus with other specified complication: Secondary | ICD-10-CM | POA: Diagnosis not present

## 2018-05-12 ENCOUNTER — Other Ambulatory Visit: Payer: Self-pay | Admitting: Family Medicine

## 2018-05-12 DIAGNOSIS — Z1231 Encounter for screening mammogram for malignant neoplasm of breast: Secondary | ICD-10-CM

## 2018-05-13 ENCOUNTER — Ambulatory Visit
Admission: RE | Admit: 2018-05-13 | Discharge: 2018-05-13 | Disposition: A | Discharge status: Home / Self Care | Payer: Medicare Other | Source: Ambulatory Visit | Attending: Family Medicine | Admitting: Family Medicine

## 2018-05-13 DIAGNOSIS — Z1231 Encounter for screening mammogram for malignant neoplasm of breast: Secondary | ICD-10-CM | POA: Diagnosis not present

## 2018-06-26 DIAGNOSIS — Z23 Encounter for immunization: Secondary | ICD-10-CM | POA: Diagnosis not present

## 2018-07-22 DIAGNOSIS — E559 Vitamin D deficiency, unspecified: Secondary | ICD-10-CM | POA: Diagnosis not present

## 2018-07-22 DIAGNOSIS — E782 Mixed hyperlipidemia: Secondary | ICD-10-CM | POA: Diagnosis not present

## 2018-07-22 DIAGNOSIS — I10 Essential (primary) hypertension: Secondary | ICD-10-CM | POA: Diagnosis not present

## 2018-07-22 DIAGNOSIS — G479 Sleep disorder, unspecified: Secondary | ICD-10-CM | POA: Diagnosis not present

## 2018-07-22 DIAGNOSIS — E039 Hypothyroidism, unspecified: Secondary | ICD-10-CM | POA: Diagnosis not present

## 2018-07-22 DIAGNOSIS — E538 Deficiency of other specified B group vitamins: Secondary | ICD-10-CM | POA: Diagnosis not present

## 2018-07-22 DIAGNOSIS — E1169 Type 2 diabetes mellitus with other specified complication: Secondary | ICD-10-CM | POA: Diagnosis not present

## 2018-07-22 DIAGNOSIS — K219 Gastro-esophageal reflux disease without esophagitis: Secondary | ICD-10-CM | POA: Diagnosis not present

## 2018-07-27 DIAGNOSIS — E782 Mixed hyperlipidemia: Secondary | ICD-10-CM | POA: Diagnosis not present

## 2018-07-27 DIAGNOSIS — E538 Deficiency of other specified B group vitamins: Secondary | ICD-10-CM | POA: Diagnosis not present

## 2018-07-27 DIAGNOSIS — E1169 Type 2 diabetes mellitus with other specified complication: Secondary | ICD-10-CM | POA: Diagnosis not present

## 2018-07-27 DIAGNOSIS — G479 Sleep disorder, unspecified: Secondary | ICD-10-CM | POA: Diagnosis not present

## 2018-07-27 DIAGNOSIS — M85852 Other specified disorders of bone density and structure, left thigh: Secondary | ICD-10-CM | POA: Diagnosis not present

## 2018-07-27 DIAGNOSIS — G609 Hereditary and idiopathic neuropathy, unspecified: Secondary | ICD-10-CM | POA: Diagnosis not present

## 2018-07-27 DIAGNOSIS — K219 Gastro-esophageal reflux disease without esophagitis: Secondary | ICD-10-CM | POA: Diagnosis not present

## 2018-07-27 DIAGNOSIS — I10 Essential (primary) hypertension: Secondary | ICD-10-CM | POA: Diagnosis not present

## 2018-07-27 DIAGNOSIS — E039 Hypothyroidism, unspecified: Secondary | ICD-10-CM | POA: Diagnosis not present

## 2018-09-18 DIAGNOSIS — E782 Mixed hyperlipidemia: Secondary | ICD-10-CM | POA: Diagnosis not present

## 2018-09-18 DIAGNOSIS — E039 Hypothyroidism, unspecified: Secondary | ICD-10-CM | POA: Diagnosis not present

## 2018-09-18 DIAGNOSIS — I10 Essential (primary) hypertension: Secondary | ICD-10-CM | POA: Diagnosis not present

## 2018-09-21 DIAGNOSIS — L509 Urticaria, unspecified: Secondary | ICD-10-CM | POA: Diagnosis not present

## 2019-03-01 DIAGNOSIS — I10 Essential (primary) hypertension: Secondary | ICD-10-CM | POA: Diagnosis not present

## 2019-03-01 DIAGNOSIS — E559 Vitamin D deficiency, unspecified: Secondary | ICD-10-CM | POA: Diagnosis not present

## 2019-03-01 DIAGNOSIS — E1169 Type 2 diabetes mellitus with other specified complication: Secondary | ICD-10-CM | POA: Diagnosis not present

## 2019-03-01 DIAGNOSIS — E538 Deficiency of other specified B group vitamins: Secondary | ICD-10-CM | POA: Diagnosis not present

## 2019-03-01 DIAGNOSIS — K219 Gastro-esophageal reflux disease without esophagitis: Secondary | ICD-10-CM | POA: Diagnosis not present

## 2019-03-01 DIAGNOSIS — G479 Sleep disorder, unspecified: Secondary | ICD-10-CM | POA: Diagnosis not present

## 2019-03-01 DIAGNOSIS — E039 Hypothyroidism, unspecified: Secondary | ICD-10-CM | POA: Diagnosis not present

## 2019-03-01 DIAGNOSIS — E782 Mixed hyperlipidemia: Secondary | ICD-10-CM | POA: Diagnosis not present

## 2019-03-04 DIAGNOSIS — E039 Hypothyroidism, unspecified: Secondary | ICD-10-CM | POA: Diagnosis not present

## 2019-03-04 DIAGNOSIS — G479 Sleep disorder, unspecified: Secondary | ICD-10-CM | POA: Diagnosis not present

## 2019-03-04 DIAGNOSIS — Z01419 Encounter for gynecological examination (general) (routine) without abnormal findings: Secondary | ICD-10-CM | POA: Diagnosis not present

## 2019-03-04 DIAGNOSIS — E1169 Type 2 diabetes mellitus with other specified complication: Secondary | ICD-10-CM | POA: Diagnosis not present

## 2019-03-04 DIAGNOSIS — E559 Vitamin D deficiency, unspecified: Secondary | ICD-10-CM | POA: Diagnosis not present

## 2019-03-04 DIAGNOSIS — N765 Ulceration of vagina: Secondary | ICD-10-CM | POA: Diagnosis not present

## 2019-03-04 DIAGNOSIS — Z1389 Encounter for screening for other disorder: Secondary | ICD-10-CM | POA: Diagnosis not present

## 2019-03-04 DIAGNOSIS — E782 Mixed hyperlipidemia: Secondary | ICD-10-CM | POA: Diagnosis not present

## 2019-03-04 DIAGNOSIS — Z0001 Encounter for general adult medical examination with abnormal findings: Secondary | ICD-10-CM | POA: Diagnosis not present

## 2019-03-04 DIAGNOSIS — J387 Other diseases of larynx: Secondary | ICD-10-CM | POA: Diagnosis not present

## 2019-03-04 DIAGNOSIS — R7989 Other specified abnormal findings of blood chemistry: Secondary | ICD-10-CM | POA: Diagnosis not present

## 2019-03-04 DIAGNOSIS — I10 Essential (primary) hypertension: Secondary | ICD-10-CM | POA: Diagnosis not present

## 2019-03-17 DIAGNOSIS — K219 Gastro-esophageal reflux disease without esophagitis: Secondary | ICD-10-CM | POA: Diagnosis not present

## 2019-06-14 DIAGNOSIS — H43393 Other vitreous opacities, bilateral: Secondary | ICD-10-CM | POA: Diagnosis not present

## 2019-06-14 DIAGNOSIS — H35372 Puckering of macula, left eye: Secondary | ICD-10-CM | POA: Diagnosis not present

## 2019-06-14 DIAGNOSIS — H35033 Hypertensive retinopathy, bilateral: Secondary | ICD-10-CM | POA: Diagnosis not present

## 2019-06-14 DIAGNOSIS — H35363 Drusen (degenerative) of macula, bilateral: Secondary | ICD-10-CM | POA: Diagnosis not present

## 2019-06-29 DIAGNOSIS — E039 Hypothyroidism, unspecified: Secondary | ICD-10-CM | POA: Diagnosis not present

## 2019-06-29 DIAGNOSIS — Z79899 Other long term (current) drug therapy: Secondary | ICD-10-CM | POA: Diagnosis not present

## 2019-06-29 DIAGNOSIS — E782 Mixed hyperlipidemia: Secondary | ICD-10-CM | POA: Diagnosis not present

## 2019-06-29 DIAGNOSIS — E538 Deficiency of other specified B group vitamins: Secondary | ICD-10-CM | POA: Diagnosis not present

## 2019-06-29 DIAGNOSIS — Z23 Encounter for immunization: Secondary | ICD-10-CM | POA: Diagnosis not present

## 2019-06-29 DIAGNOSIS — E559 Vitamin D deficiency, unspecified: Secondary | ICD-10-CM | POA: Diagnosis not present

## 2019-06-29 DIAGNOSIS — I10 Essential (primary) hypertension: Secondary | ICD-10-CM | POA: Diagnosis not present

## 2019-06-29 DIAGNOSIS — E1169 Type 2 diabetes mellitus with other specified complication: Secondary | ICD-10-CM | POA: Diagnosis not present

## 2019-06-29 DIAGNOSIS — K219 Gastro-esophageal reflux disease without esophagitis: Secondary | ICD-10-CM | POA: Diagnosis not present

## 2019-09-30 ENCOUNTER — Ambulatory Visit: Payer: Medicare Other | Attending: Internal Medicine

## 2019-09-30 DIAGNOSIS — Z23 Encounter for immunization: Secondary | ICD-10-CM | POA: Insufficient documentation

## 2019-09-30 NOTE — Progress Notes (Signed)
   Covid-19 Vaccination Clinic  Name:  Stacey Robertson    MRN: [id number] DOB: [birth year]  09/30/2019  [name] was observed post Covid-19 immunization for 15 minutes without incidence. She was provided with Vaccine Information Sheet and instruction to access the V-Safe system.   [name] was instructed to call 911 with any severe reactions post vaccine: Marland Kitchen Difficulty breathing  . Swelling of your face and throat  . A fast heartbeat  . A bad rash all over your body  . Dizziness and weakness    Immunizations Administered    Name Date Dose VIS Date Route   Pfizer COVID-19 Vaccine 09/30/2019 11:26 AM 0.3 mL 08/27/2019 Intramuscular   Manufacturer: [place]   Lot: [id number]   [place]: [id number]

## 2019-10-12 ENCOUNTER — Ambulatory Visit: Payer: Medicare Other

## 2019-10-21 ENCOUNTER — Ambulatory Visit: Payer: Medicare Other | Attending: Internal Medicine

## 2019-10-21 DIAGNOSIS — Z23 Encounter for immunization: Secondary | ICD-10-CM | POA: Insufficient documentation

## 2019-10-21 NOTE — Progress Notes (Signed)
   Covid-19 Vaccination Clinic  Name:  Stacey Robertson    MRN: [id number] DOB: 1931-08-17  10/21/2019  Stacey Robertson was observed post Covid-19 immunization for 15 minutes without incidence. She was provided with Vaccine Information Sheet and instruction to access the V-Safe system.   Stacey Robertson was instructed to call 911 with any severe reactions post vaccine: Marland Kitchen Difficulty breathing  . Swelling of your face and throat  . A fast heartbeat  . A bad rash all over your body  . Dizziness and weakness    Immunizations Administered    Name Date Dose VIS Date Route   Pfizer COVID-19 Vaccine 10/21/2019 10:17 AM 0.3 mL 08/27/2019 Intramuscular   Manufacturer: [place]   Lot: [id number]   [place]: [id number]

## 2020-06-13 DIAGNOSIS — H43393 Other vitreous opacities, bilateral: Secondary | ICD-10-CM | POA: Diagnosis not present

## 2020-06-13 DIAGNOSIS — H35372 Puckering of macula, left eye: Secondary | ICD-10-CM | POA: Diagnosis not present

## 2020-06-13 DIAGNOSIS — H2513 Age-related nuclear cataract, bilateral: Secondary | ICD-10-CM | POA: Diagnosis not present

## 2020-06-13 DIAGNOSIS — H35033 Hypertensive retinopathy, bilateral: Secondary | ICD-10-CM | POA: Diagnosis not present

## 2020-06-13 DIAGNOSIS — H35363 Drusen (degenerative) of macula, bilateral: Secondary | ICD-10-CM | POA: Diagnosis not present

## 2020-06-19 DIAGNOSIS — Z23 Encounter for immunization: Secondary | ICD-10-CM | POA: Diagnosis not present

## 2020-07-11 DIAGNOSIS — N765 Ulceration of vagina: Secondary | ICD-10-CM | POA: Diagnosis not present

## 2020-07-11 DIAGNOSIS — N815 Vaginal enterocele: Secondary | ICD-10-CM | POA: Diagnosis not present

## 2020-07-17 DIAGNOSIS — N816 Rectocele: Secondary | ICD-10-CM | POA: Diagnosis not present

## 2020-07-17 DIAGNOSIS — N9089 Other specified noninflammatory disorders of vulva and perineum: Secondary | ICD-10-CM | POA: Diagnosis not present

## 2020-07-24 DIAGNOSIS — L821 Other seborrheic keratosis: Secondary | ICD-10-CM | POA: Diagnosis not present

## 2020-07-24 DIAGNOSIS — L820 Inflamed seborrheic keratosis: Secondary | ICD-10-CM | POA: Diagnosis not present

## 2020-07-24 DIAGNOSIS — L80 Vitiligo: Secondary | ICD-10-CM | POA: Diagnosis not present

## 2020-08-03 DIAGNOSIS — N9089 Other specified noninflammatory disorders of vulva and perineum: Secondary | ICD-10-CM | POA: Diagnosis not present

## 2020-09-18 DIAGNOSIS — E538 Deficiency of other specified B group vitamins: Secondary | ICD-10-CM | POA: Diagnosis not present

## 2020-09-18 DIAGNOSIS — E1169 Type 2 diabetes mellitus with other specified complication: Secondary | ICD-10-CM | POA: Diagnosis not present

## 2020-09-18 DIAGNOSIS — E559 Vitamin D deficiency, unspecified: Secondary | ICD-10-CM | POA: Diagnosis not present

## 2020-09-18 DIAGNOSIS — M85851 Other specified disorders of bone density and structure, right thigh: Secondary | ICD-10-CM | POA: Diagnosis not present

## 2020-09-18 DIAGNOSIS — I10 Essential (primary) hypertension: Secondary | ICD-10-CM | POA: Diagnosis not present

## 2020-09-18 DIAGNOSIS — G479 Sleep disorder, unspecified: Secondary | ICD-10-CM | POA: Diagnosis not present

## 2020-09-18 DIAGNOSIS — E782 Mixed hyperlipidemia: Secondary | ICD-10-CM | POA: Diagnosis not present

## 2020-09-18 DIAGNOSIS — K219 Gastro-esophageal reflux disease without esophagitis: Secondary | ICD-10-CM | POA: Diagnosis not present

## 2020-09-18 DIAGNOSIS — E039 Hypothyroidism, unspecified: Secondary | ICD-10-CM | POA: Diagnosis not present

## 2020-09-22 DIAGNOSIS — M899 Disorder of bone, unspecified: Secondary | ICD-10-CM | POA: Diagnosis not present

## 2020-09-22 DIAGNOSIS — E1169 Type 2 diabetes mellitus with other specified complication: Secondary | ICD-10-CM | POA: Diagnosis not present

## 2020-09-22 DIAGNOSIS — E559 Vitamin D deficiency, unspecified: Secondary | ICD-10-CM | POA: Diagnosis not present

## 2020-09-22 DIAGNOSIS — E782 Mixed hyperlipidemia: Secondary | ICD-10-CM | POA: Diagnosis not present

## 2020-09-22 DIAGNOSIS — K219 Gastro-esophageal reflux disease without esophagitis: Secondary | ICD-10-CM | POA: Diagnosis not present

## 2020-09-22 DIAGNOSIS — I10 Essential (primary) hypertension: Secondary | ICD-10-CM | POA: Diagnosis not present

## 2020-09-22 DIAGNOSIS — R8290 Unspecified abnormal findings in urine: Secondary | ICD-10-CM | POA: Diagnosis not present

## 2020-09-22 DIAGNOSIS — R809 Proteinuria, unspecified: Secondary | ICD-10-CM | POA: Diagnosis not present

## 2020-09-22 DIAGNOSIS — G479 Sleep disorder, unspecified: Secondary | ICD-10-CM | POA: Diagnosis not present

## 2020-09-22 DIAGNOSIS — E538 Deficiency of other specified B group vitamins: Secondary | ICD-10-CM | POA: Diagnosis not present

## 2020-11-03 DIAGNOSIS — E538 Deficiency of other specified B group vitamins: Secondary | ICD-10-CM | POA: Diagnosis not present

## 2020-11-03 DIAGNOSIS — R809 Proteinuria, unspecified: Secondary | ICD-10-CM | POA: Diagnosis not present

## 2020-11-03 DIAGNOSIS — E039 Hypothyroidism, unspecified: Secondary | ICD-10-CM | POA: Diagnosis not present

## 2020-11-03 DIAGNOSIS — K219 Gastro-esophageal reflux disease without esophagitis: Secondary | ICD-10-CM | POA: Diagnosis not present

## 2020-11-03 DIAGNOSIS — E782 Mixed hyperlipidemia: Secondary | ICD-10-CM | POA: Diagnosis not present

## 2020-11-03 DIAGNOSIS — E559 Vitamin D deficiency, unspecified: Secondary | ICD-10-CM | POA: Diagnosis not present

## 2020-11-03 DIAGNOSIS — G479 Sleep disorder, unspecified: Secondary | ICD-10-CM | POA: Diagnosis not present

## 2020-11-03 DIAGNOSIS — M899 Disorder of bone, unspecified: Secondary | ICD-10-CM | POA: Diagnosis not present

## 2020-11-03 DIAGNOSIS — R8290 Unspecified abnormal findings in urine: Secondary | ICD-10-CM | POA: Diagnosis not present

## 2020-11-03 DIAGNOSIS — I10 Essential (primary) hypertension: Secondary | ICD-10-CM | POA: Diagnosis not present

## 2020-11-03 DIAGNOSIS — E1169 Type 2 diabetes mellitus with other specified complication: Secondary | ICD-10-CM | POA: Diagnosis not present

## 2020-12-27 DIAGNOSIS — K219 Gastro-esophageal reflux disease without esophagitis: Secondary | ICD-10-CM | POA: Diagnosis not present

## 2020-12-27 DIAGNOSIS — M1990 Unspecified osteoarthritis, unspecified site: Secondary | ICD-10-CM | POA: Diagnosis not present

## 2020-12-27 DIAGNOSIS — I10 Essential (primary) hypertension: Secondary | ICD-10-CM | POA: Diagnosis not present

## 2020-12-27 DIAGNOSIS — E663 Overweight: Secondary | ICD-10-CM | POA: Diagnosis not present

## 2020-12-27 DIAGNOSIS — G4700 Insomnia, unspecified: Secondary | ICD-10-CM | POA: Diagnosis not present

## 2020-12-27 DIAGNOSIS — E785 Hyperlipidemia, unspecified: Secondary | ICD-10-CM | POA: Diagnosis not present

## 2020-12-27 DIAGNOSIS — M810 Age-related osteoporosis without current pathological fracture: Secondary | ICD-10-CM | POA: Diagnosis not present

## 2020-12-27 DIAGNOSIS — G8929 Other chronic pain: Secondary | ICD-10-CM | POA: Diagnosis not present

## 2020-12-27 DIAGNOSIS — E039 Hypothyroidism, unspecified: Secondary | ICD-10-CM | POA: Diagnosis not present

## 2020-12-27 DIAGNOSIS — E119 Type 2 diabetes mellitus without complications: Secondary | ICD-10-CM | POA: Diagnosis not present

## 2021-01-25 DIAGNOSIS — K219 Gastro-esophageal reflux disease without esophagitis: Secondary | ICD-10-CM | POA: Diagnosis not present

## 2021-01-25 DIAGNOSIS — E782 Mixed hyperlipidemia: Secondary | ICD-10-CM | POA: Diagnosis not present

## 2021-01-25 DIAGNOSIS — E1169 Type 2 diabetes mellitus with other specified complication: Secondary | ICD-10-CM | POA: Diagnosis not present

## 2021-01-25 DIAGNOSIS — E039 Hypothyroidism, unspecified: Secondary | ICD-10-CM | POA: Diagnosis not present

## 2021-01-25 DIAGNOSIS — R69 Illness, unspecified: Secondary | ICD-10-CM | POA: Diagnosis not present

## 2021-01-25 DIAGNOSIS — E1159 Type 2 diabetes mellitus with other circulatory complications: Secondary | ICD-10-CM | POA: Diagnosis not present

## 2021-01-25 DIAGNOSIS — E119 Type 2 diabetes mellitus without complications: Secondary | ICD-10-CM | POA: Diagnosis not present

## 2021-01-25 DIAGNOSIS — I10 Essential (primary) hypertension: Secondary | ICD-10-CM | POA: Diagnosis not present

## 2021-03-27 DIAGNOSIS — Z0000 Encounter for general adult medical examination without abnormal findings: Secondary | ICD-10-CM | POA: Diagnosis not present

## 2021-03-27 DIAGNOSIS — M899 Disorder of bone, unspecified: Secondary | ICD-10-CM | POA: Diagnosis not present

## 2021-03-27 DIAGNOSIS — E538 Deficiency of other specified B group vitamins: Secondary | ICD-10-CM | POA: Diagnosis not present

## 2021-03-27 DIAGNOSIS — G479 Sleep disorder, unspecified: Secondary | ICD-10-CM | POA: Diagnosis not present

## 2021-03-27 DIAGNOSIS — E559 Vitamin D deficiency, unspecified: Secondary | ICD-10-CM | POA: Diagnosis not present

## 2021-03-27 DIAGNOSIS — E1169 Type 2 diabetes mellitus with other specified complication: Secondary | ICD-10-CM | POA: Diagnosis not present

## 2021-03-27 DIAGNOSIS — K219 Gastro-esophageal reflux disease without esophagitis: Secondary | ICD-10-CM | POA: Diagnosis not present

## 2021-03-27 DIAGNOSIS — Z1389 Encounter for screening for other disorder: Secondary | ICD-10-CM | POA: Diagnosis not present

## 2021-03-27 DIAGNOSIS — E782 Mixed hyperlipidemia: Secondary | ICD-10-CM | POA: Diagnosis not present

## 2021-03-27 DIAGNOSIS — Z7984 Long term (current) use of oral hypoglycemic drugs: Secondary | ICD-10-CM | POA: Diagnosis not present

## 2021-03-27 DIAGNOSIS — R809 Proteinuria, unspecified: Secondary | ICD-10-CM | POA: Diagnosis not present

## 2021-03-27 DIAGNOSIS — I10 Essential (primary) hypertension: Secondary | ICD-10-CM | POA: Diagnosis not present

## 2021-03-27 DIAGNOSIS — E039 Hypothyroidism, unspecified: Secondary | ICD-10-CM | POA: Diagnosis not present

## 2021-03-30 ENCOUNTER — Other Ambulatory Visit: Payer: Self-pay | Admitting: Family Medicine

## 2021-03-30 DIAGNOSIS — Z6829 Body mass index (BMI) 29.0-29.9, adult: Secondary | ICD-10-CM | POA: Diagnosis not present

## 2021-03-30 DIAGNOSIS — I10 Essential (primary) hypertension: Secondary | ICD-10-CM | POA: Diagnosis not present

## 2021-03-30 DIAGNOSIS — M85859 Other specified disorders of bone density and structure, unspecified thigh: Secondary | ICD-10-CM | POA: Diagnosis not present

## 2021-03-30 DIAGNOSIS — R809 Proteinuria, unspecified: Secondary | ICD-10-CM | POA: Diagnosis not present

## 2021-03-30 DIAGNOSIS — G479 Sleep disorder, unspecified: Secondary | ICD-10-CM | POA: Diagnosis not present

## 2021-03-30 DIAGNOSIS — E782 Mixed hyperlipidemia: Secondary | ICD-10-CM | POA: Diagnosis not present

## 2021-03-30 DIAGNOSIS — E2839 Other primary ovarian failure: Secondary | ICD-10-CM

## 2021-03-30 DIAGNOSIS — Z0110 Encounter for examination of ears and hearing without abnormal findings: Secondary | ICD-10-CM | POA: Diagnosis not present

## 2021-03-30 DIAGNOSIS — E039 Hypothyroidism, unspecified: Secondary | ICD-10-CM | POA: Diagnosis not present

## 2021-03-30 DIAGNOSIS — Z01118 Encounter for examination of ears and hearing with other abnormal findings: Secondary | ICD-10-CM | POA: Diagnosis not present

## 2021-04-04 ENCOUNTER — Other Ambulatory Visit: Payer: Self-pay | Admitting: Family Medicine

## 2021-04-04 DIAGNOSIS — Z1231 Encounter for screening mammogram for malignant neoplasm of breast: Secondary | ICD-10-CM

## 2021-04-06 ENCOUNTER — Ambulatory Visit
Admission: RE | Admit: 2021-04-06 | Discharge: 2021-04-06 | Disposition: A | Discharge status: Home / Self Care | Payer: Medicare HMO | Source: Ambulatory Visit | Attending: Family Medicine | Admitting: Family Medicine

## 2021-04-06 ENCOUNTER — Other Ambulatory Visit: Payer: Self-pay

## 2021-04-06 ENCOUNTER — Ambulatory Visit
Admission: RE | Admit: 2021-04-06 | Discharge: 2021-04-06 | Disposition: A | Discharge status: Home / Self Care | Payer: Medicare Other | Source: Ambulatory Visit | Attending: Family Medicine | Admitting: Family Medicine

## 2021-04-06 DIAGNOSIS — M85852 Other specified disorders of bone density and structure, left thigh: Secondary | ICD-10-CM | POA: Diagnosis not present

## 2021-04-06 DIAGNOSIS — Z780 Asymptomatic menopausal state: Secondary | ICD-10-CM | POA: Diagnosis not present

## 2021-04-06 DIAGNOSIS — M85832 Other specified disorders of bone density and structure, left forearm: Secondary | ICD-10-CM | POA: Diagnosis not present

## 2021-04-06 DIAGNOSIS — Z1231 Encounter for screening mammogram for malignant neoplasm of breast: Secondary | ICD-10-CM | POA: Diagnosis not present

## 2021-04-06 DIAGNOSIS — E2839 Other primary ovarian failure: Secondary | ICD-10-CM

## 2021-04-15 DIAGNOSIS — E1169 Type 2 diabetes mellitus with other specified complication: Secondary | ICD-10-CM | POA: Diagnosis not present

## 2021-04-15 DIAGNOSIS — I10 Essential (primary) hypertension: Secondary | ICD-10-CM | POA: Diagnosis not present

## 2021-04-15 DIAGNOSIS — E119 Type 2 diabetes mellitus without complications: Secondary | ICD-10-CM | POA: Diagnosis not present

## 2021-04-15 DIAGNOSIS — E1159 Type 2 diabetes mellitus with other circulatory complications: Secondary | ICD-10-CM | POA: Diagnosis not present

## 2021-04-15 DIAGNOSIS — K219 Gastro-esophageal reflux disease without esophagitis: Secondary | ICD-10-CM | POA: Diagnosis not present

## 2021-04-15 DIAGNOSIS — E782 Mixed hyperlipidemia: Secondary | ICD-10-CM | POA: Diagnosis not present

## 2021-04-15 DIAGNOSIS — E039 Hypothyroidism, unspecified: Secondary | ICD-10-CM | POA: Diagnosis not present

## 2021-04-15 DIAGNOSIS — R69 Illness, unspecified: Secondary | ICD-10-CM | POA: Diagnosis not present

## 2021-05-23 DIAGNOSIS — K219 Gastro-esophageal reflux disease without esophagitis: Secondary | ICD-10-CM | POA: Diagnosis not present

## 2021-06-07 DIAGNOSIS — K115 Sialolithiasis: Secondary | ICD-10-CM | POA: Diagnosis not present

## 2021-06-07 DIAGNOSIS — J382 Nodules of vocal cords: Secondary | ICD-10-CM | POA: Diagnosis not present

## 2021-06-07 DIAGNOSIS — Z79899 Other long term (current) drug therapy: Secondary | ICD-10-CM | POA: Diagnosis not present

## 2021-06-07 DIAGNOSIS — Z23 Encounter for immunization: Secondary | ICD-10-CM | POA: Diagnosis not present

## 2021-06-07 DIAGNOSIS — G479 Sleep disorder, unspecified: Secondary | ICD-10-CM | POA: Diagnosis not present

## 2021-06-18 DIAGNOSIS — M810 Age-related osteoporosis without current pathological fracture: Secondary | ICD-10-CM | POA: Diagnosis not present

## 2021-06-18 DIAGNOSIS — E1169 Type 2 diabetes mellitus with other specified complication: Secondary | ICD-10-CM | POA: Diagnosis not present

## 2021-06-18 DIAGNOSIS — I10 Essential (primary) hypertension: Secondary | ICD-10-CM | POA: Diagnosis not present

## 2021-06-18 DIAGNOSIS — K219 Gastro-esophageal reflux disease without esophagitis: Secondary | ICD-10-CM | POA: Diagnosis not present

## 2021-06-18 DIAGNOSIS — R69 Illness, unspecified: Secondary | ICD-10-CM | POA: Diagnosis not present

## 2021-06-18 DIAGNOSIS — E1159 Type 2 diabetes mellitus with other circulatory complications: Secondary | ICD-10-CM | POA: Diagnosis not present

## 2021-06-18 DIAGNOSIS — E782 Mixed hyperlipidemia: Secondary | ICD-10-CM | POA: Diagnosis not present

## 2021-06-18 DIAGNOSIS — E039 Hypothyroidism, unspecified: Secondary | ICD-10-CM | POA: Diagnosis not present

## 2021-06-20 ENCOUNTER — Other Ambulatory Visit: Payer: Self-pay | Admitting: Family Medicine

## 2021-06-20 DIAGNOSIS — S30861A Insect bite (nonvenomous) of abdominal wall, initial encounter: Secondary | ICD-10-CM | POA: Diagnosis not present

## 2021-06-20 DIAGNOSIS — S40861A Insect bite (nonvenomous) of right upper arm, initial encounter: Secondary | ICD-10-CM | POA: Diagnosis not present

## 2021-06-20 DIAGNOSIS — K115 Sialolithiasis: Secondary | ICD-10-CM

## 2021-06-20 DIAGNOSIS — Z23 Encounter for immunization: Secondary | ICD-10-CM | POA: Diagnosis not present

## 2021-07-04 DIAGNOSIS — L309 Dermatitis, unspecified: Secondary | ICD-10-CM | POA: Diagnosis not present

## 2021-07-04 DIAGNOSIS — L298 Other pruritus: Secondary | ICD-10-CM | POA: Diagnosis not present

## 2021-07-04 DIAGNOSIS — Z23 Encounter for immunization: Secondary | ICD-10-CM | POA: Diagnosis not present

## 2021-07-04 DIAGNOSIS — L821 Other seborrheic keratosis: Secondary | ICD-10-CM | POA: Diagnosis not present

## 2021-07-09 DIAGNOSIS — R69 Illness, unspecified: Secondary | ICD-10-CM | POA: Diagnosis not present

## 2021-07-09 DIAGNOSIS — I10 Essential (primary) hypertension: Secondary | ICD-10-CM | POA: Diagnosis not present

## 2021-07-16 DIAGNOSIS — I10 Essential (primary) hypertension: Secondary | ICD-10-CM | POA: Diagnosis not present

## 2021-07-18 DIAGNOSIS — H25013 Cortical age-related cataract, bilateral: Secondary | ICD-10-CM | POA: Diagnosis not present

## 2021-07-18 DIAGNOSIS — H35033 Hypertensive retinopathy, bilateral: Secondary | ICD-10-CM | POA: Diagnosis not present

## 2021-07-18 DIAGNOSIS — H2513 Age-related nuclear cataract, bilateral: Secondary | ICD-10-CM | POA: Diagnosis not present

## 2021-07-18 DIAGNOSIS — E119 Type 2 diabetes mellitus without complications: Secondary | ICD-10-CM | POA: Diagnosis not present

## 2021-08-07 DIAGNOSIS — I10 Essential (primary) hypertension: Secondary | ICD-10-CM | POA: Diagnosis not present

## 2021-09-27 DIAGNOSIS — H524 Presbyopia: Secondary | ICD-10-CM | POA: Diagnosis not present

## 2021-09-27 DIAGNOSIS — H5203 Hypermetropia, bilateral: Secondary | ICD-10-CM | POA: Diagnosis not present

## 2021-09-27 DIAGNOSIS — H52209 Unspecified astigmatism, unspecified eye: Secondary | ICD-10-CM | POA: Diagnosis not present

## 2021-09-28 DIAGNOSIS — R809 Proteinuria, unspecified: Secondary | ICD-10-CM | POA: Diagnosis not present

## 2021-09-28 DIAGNOSIS — E1169 Type 2 diabetes mellitus with other specified complication: Secondary | ICD-10-CM | POA: Diagnosis not present

## 2021-09-28 DIAGNOSIS — Z7984 Long term (current) use of oral hypoglycemic drugs: Secondary | ICD-10-CM | POA: Diagnosis not present

## 2021-09-28 DIAGNOSIS — E782 Mixed hyperlipidemia: Secondary | ICD-10-CM | POA: Diagnosis not present

## 2021-09-28 DIAGNOSIS — Z79899 Other long term (current) drug therapy: Secondary | ICD-10-CM | POA: Diagnosis not present

## 2021-10-05 DIAGNOSIS — M85852 Other specified disorders of bone density and structure, left thigh: Secondary | ICD-10-CM | POA: Diagnosis not present

## 2021-10-05 DIAGNOSIS — E039 Hypothyroidism, unspecified: Secondary | ICD-10-CM | POA: Diagnosis not present

## 2021-10-05 DIAGNOSIS — E782 Mixed hyperlipidemia: Secondary | ICD-10-CM | POA: Diagnosis not present

## 2021-10-05 DIAGNOSIS — E1169 Type 2 diabetes mellitus with other specified complication: Secondary | ICD-10-CM | POA: Diagnosis not present

## 2021-10-05 DIAGNOSIS — G479 Sleep disorder, unspecified: Secondary | ICD-10-CM | POA: Diagnosis not present

## 2021-10-17 DIAGNOSIS — E039 Hypothyroidism, unspecified: Secondary | ICD-10-CM | POA: Diagnosis not present

## 2021-10-17 DIAGNOSIS — I10 Essential (primary) hypertension: Secondary | ICD-10-CM | POA: Diagnosis not present

## 2021-10-17 DIAGNOSIS — Z008 Encounter for other general examination: Secondary | ICD-10-CM | POA: Diagnosis not present

## 2021-10-17 DIAGNOSIS — G4700 Insomnia, unspecified: Secondary | ICD-10-CM | POA: Diagnosis not present

## 2021-10-17 DIAGNOSIS — Z7982 Long term (current) use of aspirin: Secondary | ICD-10-CM | POA: Diagnosis not present

## 2021-10-17 DIAGNOSIS — Z7984 Long term (current) use of oral hypoglycemic drugs: Secondary | ICD-10-CM | POA: Diagnosis not present

## 2021-10-17 DIAGNOSIS — Z803 Family history of malignant neoplasm of breast: Secondary | ICD-10-CM | POA: Diagnosis not present

## 2021-10-17 DIAGNOSIS — Z8249 Family history of ischemic heart disease and other diseases of the circulatory system: Secondary | ICD-10-CM | POA: Diagnosis not present

## 2021-10-17 DIAGNOSIS — E663 Overweight: Secondary | ICD-10-CM | POA: Diagnosis not present

## 2021-10-17 DIAGNOSIS — Z6828 Body mass index (BMI) 28.0-28.9, adult: Secondary | ICD-10-CM | POA: Diagnosis not present

## 2021-10-17 DIAGNOSIS — E119 Type 2 diabetes mellitus without complications: Secondary | ICD-10-CM | POA: Diagnosis not present

## 2021-10-17 DIAGNOSIS — K219 Gastro-esophageal reflux disease without esophagitis: Secondary | ICD-10-CM | POA: Diagnosis not present

## 2021-10-17 DIAGNOSIS — E785 Hyperlipidemia, unspecified: Secondary | ICD-10-CM | POA: Diagnosis not present

## 2021-12-04 DIAGNOSIS — E039 Hypothyroidism, unspecified: Secondary | ICD-10-CM | POA: Diagnosis not present

## 2021-12-04 DIAGNOSIS — K219 Gastro-esophageal reflux disease without esophagitis: Secondary | ICD-10-CM | POA: Diagnosis not present

## 2021-12-04 DIAGNOSIS — R69 Illness, unspecified: Secondary | ICD-10-CM | POA: Diagnosis not present

## 2021-12-04 DIAGNOSIS — E1169 Type 2 diabetes mellitus with other specified complication: Secondary | ICD-10-CM | POA: Diagnosis not present

## 2021-12-04 DIAGNOSIS — I10 Essential (primary) hypertension: Secondary | ICD-10-CM | POA: Diagnosis not present

## 2021-12-04 DIAGNOSIS — E1159 Type 2 diabetes mellitus with other circulatory complications: Secondary | ICD-10-CM | POA: Diagnosis not present

## 2021-12-04 DIAGNOSIS — E782 Mixed hyperlipidemia: Secondary | ICD-10-CM | POA: Diagnosis not present

## 2021-12-04 DIAGNOSIS — M810 Age-related osteoporosis without current pathological fracture: Secondary | ICD-10-CM | POA: Diagnosis not present

## 2022-01-14 DIAGNOSIS — H2512 Age-related nuclear cataract, left eye: Secondary | ICD-10-CM | POA: Diagnosis not present

## 2022-01-14 DIAGNOSIS — H524 Presbyopia: Secondary | ICD-10-CM | POA: Diagnosis not present

## 2022-01-14 DIAGNOSIS — H35033 Hypertensive retinopathy, bilateral: Secondary | ICD-10-CM | POA: Diagnosis not present

## 2022-01-14 DIAGNOSIS — E119 Type 2 diabetes mellitus without complications: Secondary | ICD-10-CM | POA: Diagnosis not present

## 2022-01-14 DIAGNOSIS — H25013 Cortical age-related cataract, bilateral: Secondary | ICD-10-CM | POA: Diagnosis not present

## 2022-01-14 DIAGNOSIS — H35363 Drusen (degenerative) of macula, bilateral: Secondary | ICD-10-CM | POA: Diagnosis not present

## 2022-01-14 DIAGNOSIS — D492 Neoplasm of unspecified behavior of bone, soft tissue, and skin: Secondary | ICD-10-CM | POA: Diagnosis not present

## 2022-01-14 DIAGNOSIS — H2513 Age-related nuclear cataract, bilateral: Secondary | ICD-10-CM | POA: Diagnosis not present

## 2022-03-20 DIAGNOSIS — E039 Hypothyroidism, unspecified: Secondary | ICD-10-CM | POA: Diagnosis not present

## 2022-03-20 DIAGNOSIS — R809 Proteinuria, unspecified: Secondary | ICD-10-CM | POA: Diagnosis not present

## 2022-03-20 DIAGNOSIS — E1169 Type 2 diabetes mellitus with other specified complication: Secondary | ICD-10-CM | POA: Diagnosis not present

## 2022-03-26 DIAGNOSIS — H25812 Combined forms of age-related cataract, left eye: Secondary | ICD-10-CM | POA: Diagnosis not present

## 2022-03-26 DIAGNOSIS — H2512 Age-related nuclear cataract, left eye: Secondary | ICD-10-CM | POA: Diagnosis not present

## 2022-03-28 DIAGNOSIS — E039 Hypothyroidism, unspecified: Secondary | ICD-10-CM | POA: Diagnosis not present

## 2022-03-28 DIAGNOSIS — E1169 Type 2 diabetes mellitus with other specified complication: Secondary | ICD-10-CM | POA: Diagnosis not present

## 2022-03-28 DIAGNOSIS — H35033 Hypertensive retinopathy, bilateral: Secondary | ICD-10-CM | POA: Diagnosis not present

## 2022-03-28 DIAGNOSIS — I10 Essential (primary) hypertension: Secondary | ICD-10-CM | POA: Diagnosis not present

## 2022-03-28 DIAGNOSIS — E538 Deficiency of other specified B group vitamins: Secondary | ICD-10-CM | POA: Diagnosis not present

## 2022-03-28 DIAGNOSIS — M810 Age-related osteoporosis without current pathological fracture: Secondary | ICD-10-CM | POA: Diagnosis not present

## 2022-03-28 DIAGNOSIS — R809 Proteinuria, unspecified: Secondary | ICD-10-CM | POA: Diagnosis not present

## 2022-03-28 DIAGNOSIS — H9193 Unspecified hearing loss, bilateral: Secondary | ICD-10-CM | POA: Diagnosis not present

## 2022-03-28 DIAGNOSIS — J387 Other diseases of larynx: Secondary | ICD-10-CM | POA: Diagnosis not present

## 2022-03-28 DIAGNOSIS — E782 Mixed hyperlipidemia: Secondary | ICD-10-CM | POA: Diagnosis not present

## 2022-03-28 DIAGNOSIS — G479 Sleep disorder, unspecified: Secondary | ICD-10-CM | POA: Diagnosis not present

## 2022-03-28 DIAGNOSIS — E559 Vitamin D deficiency, unspecified: Secondary | ICD-10-CM | POA: Diagnosis not present

## 2022-04-18 DIAGNOSIS — Z0000 Encounter for general adult medical examination without abnormal findings: Secondary | ICD-10-CM | POA: Diagnosis not present

## 2022-04-18 DIAGNOSIS — Z1389 Encounter for screening for other disorder: Secondary | ICD-10-CM | POA: Diagnosis not present

## 2022-04-18 DIAGNOSIS — Z79899 Other long term (current) drug therapy: Secondary | ICD-10-CM | POA: Diagnosis not present

## 2022-04-18 DIAGNOSIS — Z23 Encounter for immunization: Secondary | ICD-10-CM | POA: Diagnosis not present

## 2022-05-27 DIAGNOSIS — K219 Gastro-esophageal reflux disease without esophagitis: Secondary | ICD-10-CM | POA: Diagnosis not present

## 2022-05-27 DIAGNOSIS — M85852 Other specified disorders of bone density and structure, left thigh: Secondary | ICD-10-CM | POA: Diagnosis not present

## 2022-06-12 DIAGNOSIS — L570 Actinic keratosis: Secondary | ICD-10-CM | POA: Diagnosis not present

## 2022-06-12 DIAGNOSIS — L308 Other specified dermatitis: Secondary | ICD-10-CM | POA: Diagnosis not present

## 2022-06-12 DIAGNOSIS — R238 Other skin changes: Secondary | ICD-10-CM | POA: Diagnosis not present

## 2022-06-12 DIAGNOSIS — L309 Dermatitis, unspecified: Secondary | ICD-10-CM | POA: Diagnosis not present

## 2022-07-03 DIAGNOSIS — L821 Other seborrheic keratosis: Secondary | ICD-10-CM | POA: Diagnosis not present

## 2022-07-10 IMAGING — MG MM DIGITAL SCREENING BILAT W/ TOMO AND CAD
8 series · 9 of 24 positions shown · non-contrast
Comparison: Previous exam(s).

CLINICAL DATA: Screening.

EXAM:
DIGITAL SCREENING BILATERAL MAMMOGRAM WITH TOMOSYNTHESIS AND CAD
TECHNIQUE: Bilateral screening digital craniocaudal and mediolateral oblique
mammograms were obtained. Bilateral screening digital breast
tomosynthesis was performed. The images were evaluated with
computer-aided detection.

[R MLO synth-2D]
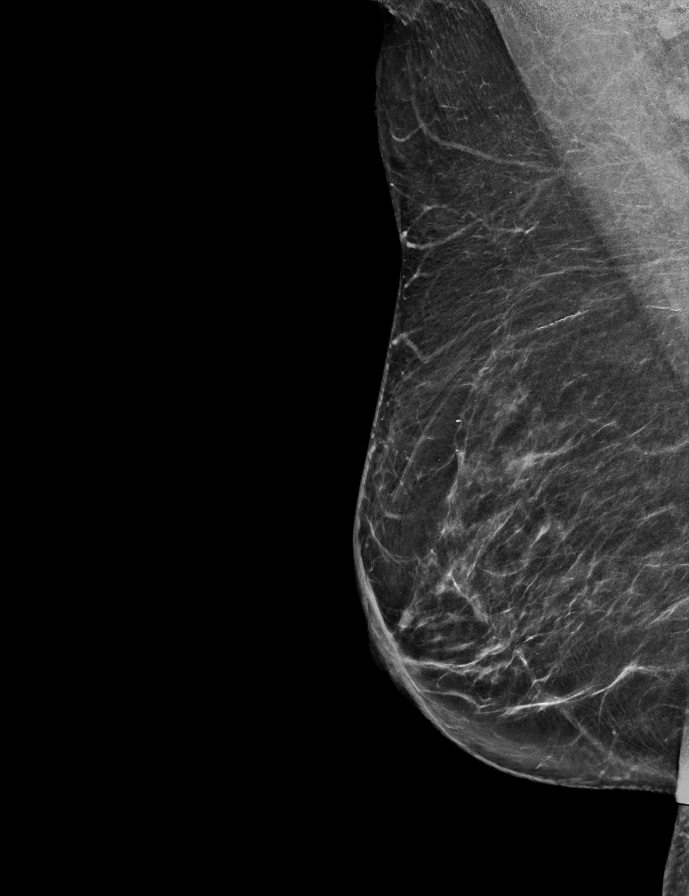

[L CC synth-2D]
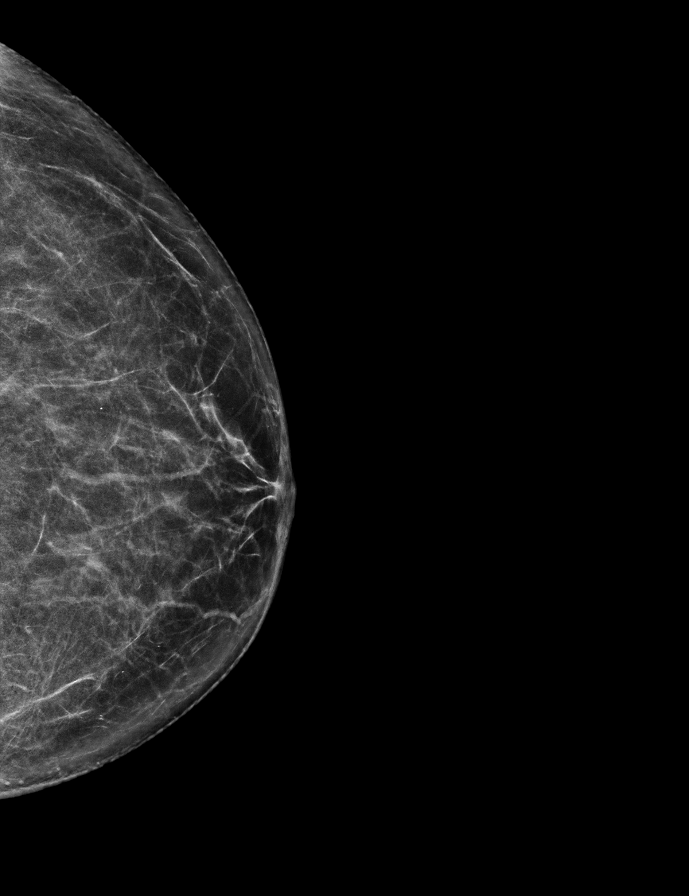

[R CC synth-2D]
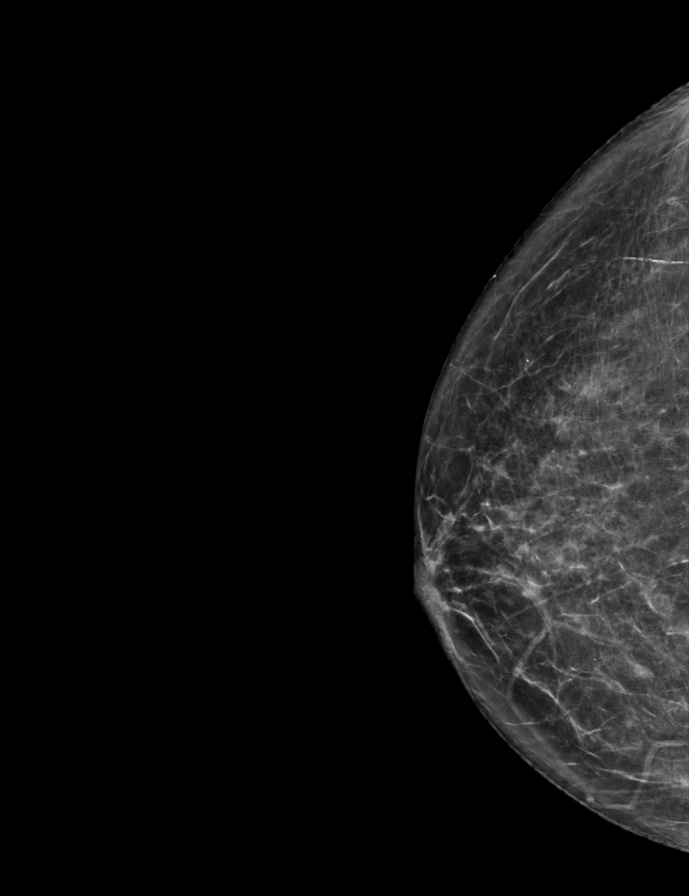

[L MLO synth-2D]
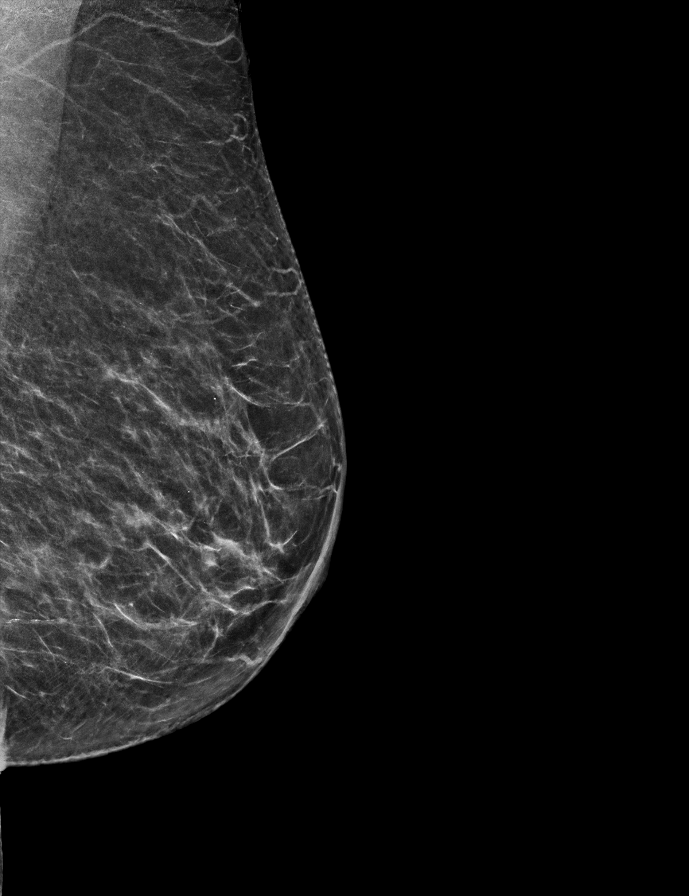

[L CC tomo · 2 of 59 frames shown]
[frame 20/59]
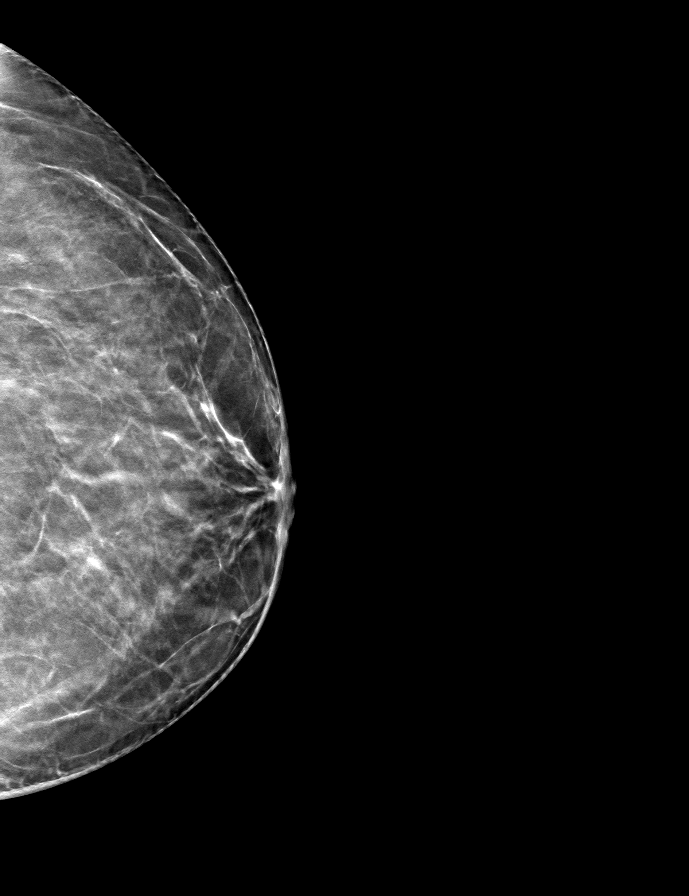
[frame 30/59]
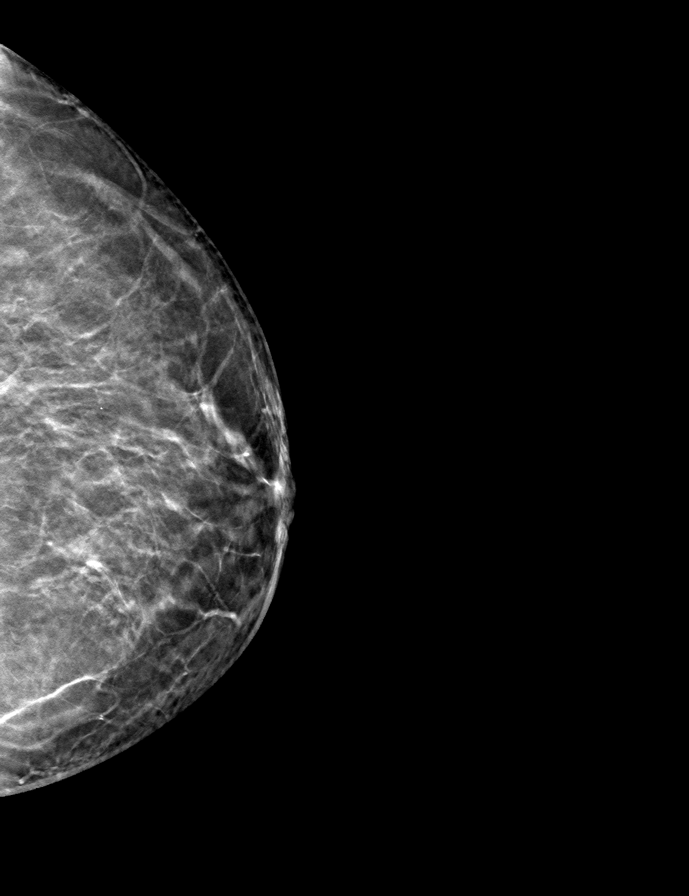

[L MLO tomo · tomo slice 30/59.0]
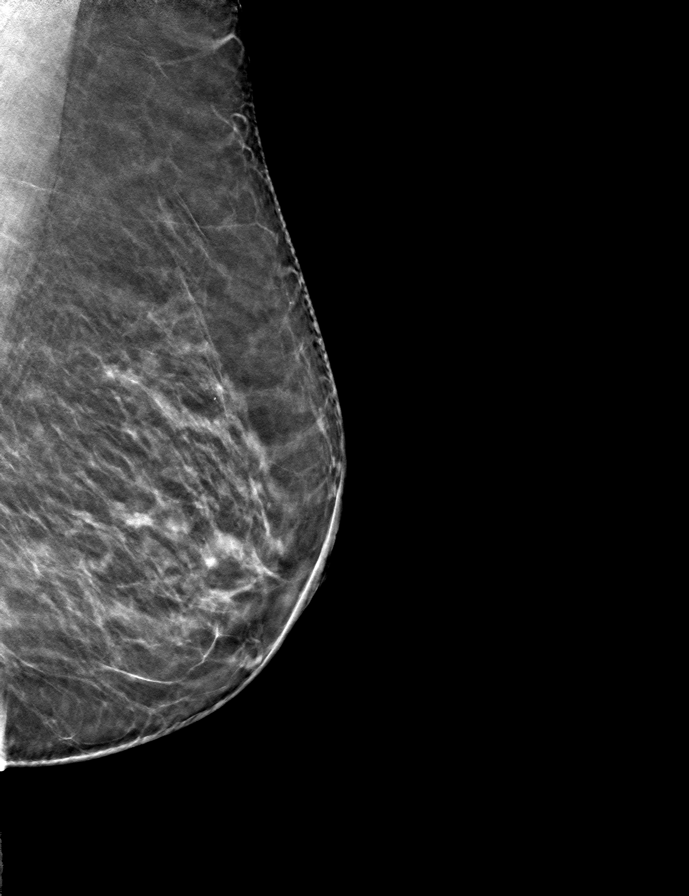

[R MLO tomo · tomo slice 34/67.0]
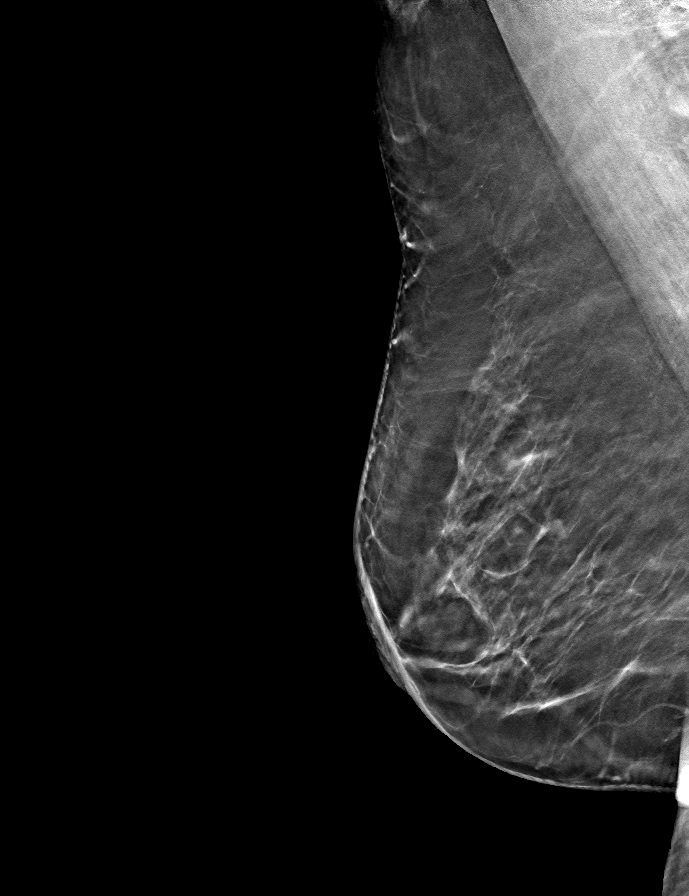

[R CC tomo · tomo slice 29/58.0]
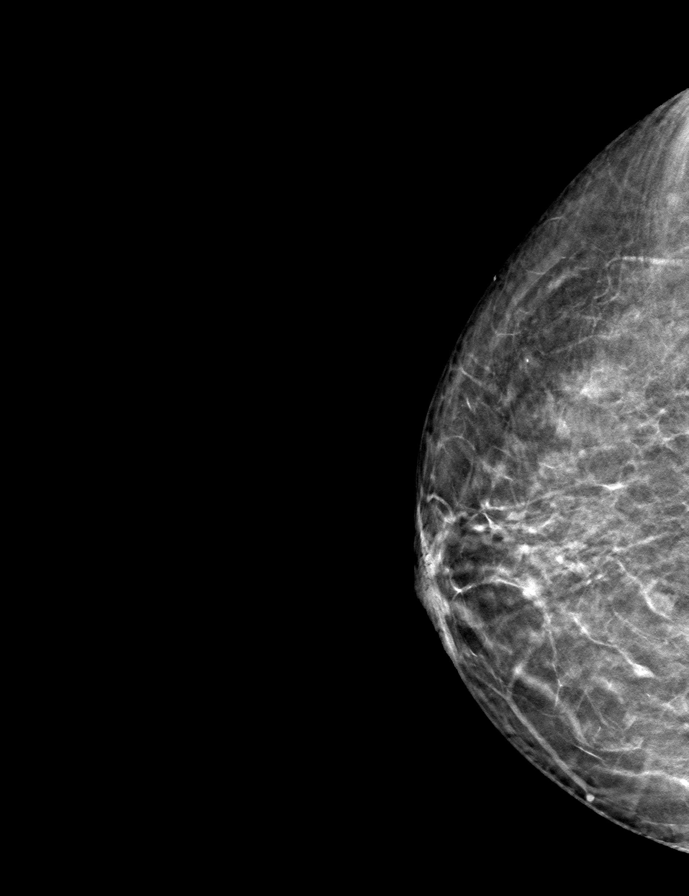

[9 of 24 positions shown; findings below may reference images not displayed]

ACR Breast Density Category b: There are scattered areas of
fibroglandular density.
FINDINGS: There are no findings suspicious for malignancy.
IMPRESSION: No mammographic evidence of malignancy. A result letter of this
screening mammogram will be mailed directly to the patient.

RECOMMENDATION:
Screening mammogram in one year. (Code:51-O-LD2)

BI-RADS CATEGORY  1: Negative.

## 2022-08-28 DIAGNOSIS — Z6829 Body mass index (BMI) 29.0-29.9, adult: Secondary | ICD-10-CM | POA: Diagnosis not present

## 2022-08-28 DIAGNOSIS — K219 Gastro-esophageal reflux disease without esophagitis: Secondary | ICD-10-CM | POA: Diagnosis not present

## 2022-08-28 DIAGNOSIS — R0981 Nasal congestion: Secondary | ICD-10-CM | POA: Diagnosis not present

## 2022-08-28 DIAGNOSIS — R051 Acute cough: Secondary | ICD-10-CM | POA: Diagnosis not present

## 2022-08-28 DIAGNOSIS — M810 Age-related osteoporosis without current pathological fracture: Secondary | ICD-10-CM | POA: Diagnosis not present

## 2022-08-28 DIAGNOSIS — Z20822 Contact with and (suspected) exposure to covid-19: Secondary | ICD-10-CM | POA: Diagnosis not present

## 2022-08-28 DIAGNOSIS — E039 Hypothyroidism, unspecified: Secondary | ICD-10-CM | POA: Diagnosis not present

## 2022-08-28 DIAGNOSIS — I10 Essential (primary) hypertension: Secondary | ICD-10-CM | POA: Diagnosis not present

## 2022-08-28 DIAGNOSIS — E782 Mixed hyperlipidemia: Secondary | ICD-10-CM | POA: Diagnosis not present

## 2022-08-28 DIAGNOSIS — E1169 Type 2 diabetes mellitus with other specified complication: Secondary | ICD-10-CM | POA: Diagnosis not present

## 2022-09-02 DIAGNOSIS — D492 Neoplasm of unspecified behavior of bone, soft tissue, and skin: Secondary | ICD-10-CM | POA: Diagnosis not present

## 2022-09-12 DIAGNOSIS — H35363 Drusen (degenerative) of macula, bilateral: Secondary | ICD-10-CM | POA: Diagnosis not present

## 2022-09-12 DIAGNOSIS — E119 Type 2 diabetes mellitus without complications: Secondary | ICD-10-CM | POA: Diagnosis not present

## 2022-09-12 DIAGNOSIS — H40031 Anatomical narrow angle, right eye: Secondary | ICD-10-CM | POA: Diagnosis not present

## 2022-09-12 DIAGNOSIS — H35033 Hypertensive retinopathy, bilateral: Secondary | ICD-10-CM | POA: Diagnosis not present

## 2022-09-12 DIAGNOSIS — H35372 Puckering of macula, left eye: Secondary | ICD-10-CM | POA: Diagnosis not present

## 2022-09-12 DIAGNOSIS — H2511 Age-related nuclear cataract, right eye: Secondary | ICD-10-CM | POA: Diagnosis not present

## 2022-09-20 DIAGNOSIS — E538 Deficiency of other specified B group vitamins: Secondary | ICD-10-CM | POA: Diagnosis not present

## 2022-09-20 DIAGNOSIS — E1169 Type 2 diabetes mellitus with other specified complication: Secondary | ICD-10-CM | POA: Diagnosis not present

## 2022-09-20 DIAGNOSIS — E559 Vitamin D deficiency, unspecified: Secondary | ICD-10-CM | POA: Diagnosis not present

## 2022-09-20 DIAGNOSIS — E039 Hypothyroidism, unspecified: Secondary | ICD-10-CM | POA: Diagnosis not present

## 2022-09-26 DIAGNOSIS — E1169 Type 2 diabetes mellitus with other specified complication: Secondary | ICD-10-CM | POA: Diagnosis not present

## 2022-09-26 DIAGNOSIS — E039 Hypothyroidism, unspecified: Secondary | ICD-10-CM | POA: Diagnosis not present

## 2022-09-26 DIAGNOSIS — Z23 Encounter for immunization: Secondary | ICD-10-CM | POA: Diagnosis not present

## 2022-09-26 DIAGNOSIS — Z6828 Body mass index (BMI) 28.0-28.9, adult: Secondary | ICD-10-CM | POA: Diagnosis not present

## 2022-09-26 DIAGNOSIS — G479 Sleep disorder, unspecified: Secondary | ICD-10-CM | POA: Diagnosis not present

## 2022-09-26 DIAGNOSIS — E538 Deficiency of other specified B group vitamins: Secondary | ICD-10-CM | POA: Diagnosis not present

## 2022-09-26 DIAGNOSIS — I10 Essential (primary) hypertension: Secondary | ICD-10-CM | POA: Diagnosis not present

## 2022-09-26 DIAGNOSIS — E782 Mixed hyperlipidemia: Secondary | ICD-10-CM | POA: Diagnosis not present

## 2022-09-26 DIAGNOSIS — K219 Gastro-esophageal reflux disease without esophagitis: Secondary | ICD-10-CM | POA: Diagnosis not present

## 2023-01-03 DIAGNOSIS — R801 Persistent proteinuria, unspecified: Secondary | ICD-10-CM | POA: Diagnosis not present

## 2023-01-03 DIAGNOSIS — E039 Hypothyroidism, unspecified: Secondary | ICD-10-CM | POA: Diagnosis not present

## 2023-01-21 DIAGNOSIS — H903 Sensorineural hearing loss, bilateral: Secondary | ICD-10-CM | POA: Diagnosis not present

## 2023-01-21 DIAGNOSIS — E119 Type 2 diabetes mellitus without complications: Secondary | ICD-10-CM | POA: Diagnosis not present

## 2023-01-21 DIAGNOSIS — E039 Hypothyroidism, unspecified: Secondary | ICD-10-CM | POA: Diagnosis not present

## 2023-03-18 DIAGNOSIS — M1990 Unspecified osteoarthritis, unspecified site: Secondary | ICD-10-CM | POA: Diagnosis not present

## 2023-03-18 DIAGNOSIS — R269 Unspecified abnormalities of gait and mobility: Secondary | ICD-10-CM | POA: Diagnosis not present

## 2023-03-18 DIAGNOSIS — E785 Hyperlipidemia, unspecified: Secondary | ICD-10-CM | POA: Diagnosis not present

## 2023-03-18 DIAGNOSIS — E039 Hypothyroidism, unspecified: Secondary | ICD-10-CM | POA: Diagnosis not present

## 2023-03-18 DIAGNOSIS — E1122 Type 2 diabetes mellitus with diabetic chronic kidney disease: Secondary | ICD-10-CM | POA: Diagnosis not present

## 2023-03-18 DIAGNOSIS — N189 Chronic kidney disease, unspecified: Secondary | ICD-10-CM | POA: Diagnosis not present

## 2023-03-18 DIAGNOSIS — K219 Gastro-esophageal reflux disease without esophagitis: Secondary | ICD-10-CM | POA: Diagnosis not present

## 2023-03-18 DIAGNOSIS — I129 Hypertensive chronic kidney disease with stage 1 through stage 4 chronic kidney disease, or unspecified chronic kidney disease: Secondary | ICD-10-CM | POA: Diagnosis not present

## 2023-03-18 DIAGNOSIS — F324 Major depressive disorder, single episode, in partial remission: Secondary | ICD-10-CM | POA: Diagnosis not present

## 2023-03-18 DIAGNOSIS — M810 Age-related osteoporosis without current pathological fracture: Secondary | ICD-10-CM | POA: Diagnosis not present

## 2023-03-28 DIAGNOSIS — E1169 Type 2 diabetes mellitus with other specified complication: Secondary | ICD-10-CM | POA: Diagnosis not present

## 2023-03-28 DIAGNOSIS — E039 Hypothyroidism, unspecified: Secondary | ICD-10-CM | POA: Diagnosis not present

## 2023-04-02 DIAGNOSIS — E538 Deficiency of other specified B group vitamins: Secondary | ICD-10-CM | POA: Diagnosis not present

## 2023-04-02 DIAGNOSIS — Z6828 Body mass index (BMI) 28.0-28.9, adult: Secondary | ICD-10-CM | POA: Diagnosis not present

## 2023-04-02 DIAGNOSIS — I10 Essential (primary) hypertension: Secondary | ICD-10-CM | POA: Diagnosis not present

## 2023-04-02 DIAGNOSIS — E782 Mixed hyperlipidemia: Secondary | ICD-10-CM | POA: Diagnosis not present

## 2023-04-02 DIAGNOSIS — G479 Sleep disorder, unspecified: Secondary | ICD-10-CM | POA: Diagnosis not present

## 2023-04-02 DIAGNOSIS — F32A Depression, unspecified: Secondary | ICD-10-CM | POA: Diagnosis not present

## 2023-04-02 DIAGNOSIS — K219 Gastro-esophageal reflux disease without esophagitis: Secondary | ICD-10-CM | POA: Diagnosis not present

## 2023-04-02 DIAGNOSIS — R801 Persistent proteinuria, unspecified: Secondary | ICD-10-CM | POA: Diagnosis not present

## 2023-04-02 DIAGNOSIS — E039 Hypothyroidism, unspecified: Secondary | ICD-10-CM | POA: Diagnosis not present

## 2023-04-02 DIAGNOSIS — E119 Type 2 diabetes mellitus without complications: Secondary | ICD-10-CM | POA: Diagnosis not present

## 2023-04-02 DIAGNOSIS — Z79899 Other long term (current) drug therapy: Secondary | ICD-10-CM | POA: Diagnosis not present

## 2023-04-22 DIAGNOSIS — Z9181 History of falling: Secondary | ICD-10-CM | POA: Diagnosis not present

## 2023-04-22 DIAGNOSIS — E663 Overweight: Secondary | ICD-10-CM | POA: Diagnosis not present

## 2023-04-22 DIAGNOSIS — Z1389 Encounter for screening for other disorder: Secondary | ICD-10-CM | POA: Diagnosis not present

## 2023-04-22 DIAGNOSIS — Z0000 Encounter for general adult medical examination without abnormal findings: Secondary | ICD-10-CM | POA: Diagnosis not present

## 2023-05-14 DIAGNOSIS — G479 Sleep disorder, unspecified: Secondary | ICD-10-CM | POA: Diagnosis not present

## 2023-05-14 DIAGNOSIS — Z6828 Body mass index (BMI) 28.0-28.9, adult: Secondary | ICD-10-CM | POA: Diagnosis not present

## 2023-05-14 DIAGNOSIS — F329 Major depressive disorder, single episode, unspecified: Secondary | ICD-10-CM | POA: Diagnosis not present

## 2023-05-22 DIAGNOSIS — E119 Type 2 diabetes mellitus without complications: Secondary | ICD-10-CM | POA: Diagnosis not present

## 2023-05-22 DIAGNOSIS — H0102A Squamous blepharitis right eye, upper and lower eyelids: Secondary | ICD-10-CM | POA: Diagnosis not present

## 2023-05-22 DIAGNOSIS — H26492 Other secondary cataract, left eye: Secondary | ICD-10-CM | POA: Diagnosis not present

## 2023-05-22 DIAGNOSIS — H2511 Age-related nuclear cataract, right eye: Secondary | ICD-10-CM | POA: Diagnosis not present

## 2023-06-16 DIAGNOSIS — K219 Gastro-esophageal reflux disease without esophagitis: Secondary | ICD-10-CM | POA: Diagnosis not present

## 2024-04-03 ENCOUNTER — Other Ambulatory Visit (HOSPITAL_BASED_OUTPATIENT_CLINIC_OR_DEPARTMENT_OTHER): Payer: Self-pay | Admitting: Family Medicine

## 2024-04-03 DIAGNOSIS — M810 Age-related osteoporosis without current pathological fracture: Secondary | ICD-10-CM

## 2024-04-07 DIAGNOSIS — H2511 Age-related nuclear cataract, right eye: Secondary | ICD-10-CM | POA: Diagnosis not present

## 2024-04-07 DIAGNOSIS — H26492 Other secondary cataract, left eye: Secondary | ICD-10-CM | POA: Diagnosis not present

## 2024-04-07 DIAGNOSIS — H353131 Nonexudative age-related macular degeneration, bilateral, early dry stage: Secondary | ICD-10-CM | POA: Diagnosis not present

## 2024-04-07 DIAGNOSIS — G43809 Other migraine, not intractable, without status migrainosus: Secondary | ICD-10-CM | POA: Diagnosis not present

## 2024-04-22 DIAGNOSIS — Z23 Encounter for immunization: Secondary | ICD-10-CM | POA: Diagnosis not present

## 2024-04-22 DIAGNOSIS — Z0000 Encounter for general adult medical examination without abnormal findings: Secondary | ICD-10-CM | POA: Diagnosis not present

## 2024-04-22 DIAGNOSIS — M810 Age-related osteoporosis without current pathological fracture: Secondary | ICD-10-CM | POA: Diagnosis not present

## 2024-06-01 DIAGNOSIS — Z1231 Encounter for screening mammogram for malignant neoplasm of breast: Secondary | ICD-10-CM | POA: Diagnosis not present
